# Patient Record
Sex: Female | Born: 1980 | Race: White | Hispanic: No | Marital: Married | Smoking: Current some day smoker
Health system: Southern US, Community
[De-identification: ages and names within clinical notes are randomized; demographics above are authoritative.]

## PROBLEM LIST (undated history)

## (undated) DIAGNOSIS — G4730 Sleep apnea, unspecified: Secondary | ICD-10-CM

## (undated) DIAGNOSIS — F32A Depression, unspecified: Secondary | ICD-10-CM

## (undated) DIAGNOSIS — T7840XA Allergy, unspecified, initial encounter: Secondary | ICD-10-CM

## (undated) DIAGNOSIS — M1990 Unspecified osteoarthritis, unspecified site: Secondary | ICD-10-CM

## (undated) DIAGNOSIS — F909 Attention-deficit hyperactivity disorder, unspecified type: Secondary | ICD-10-CM

## (undated) DIAGNOSIS — G43009 Migraine without aura, not intractable, without status migrainosus: Secondary | ICD-10-CM

## (undated) DIAGNOSIS — F419 Anxiety disorder, unspecified: Secondary | ICD-10-CM

## (undated) HISTORY — DX: Unspecified osteoarthritis, unspecified site: M19.90

## (undated) HISTORY — DX: Attention-deficit hyperactivity disorder, unspecified type: F90.9

## (undated) HISTORY — DX: Migraine without aura, not intractable, without status migrainosus: G43.009

## (undated) HISTORY — DX: Allergy, unspecified, initial encounter: T78.40XA

## (undated) HISTORY — DX: Depression, unspecified: F32.A

## (undated) HISTORY — PX: MOUTH SURGERY: SHX715

## (undated) HISTORY — DX: Sleep apnea, unspecified: G47.30

## (undated) HISTORY — DX: Anxiety disorder, unspecified: F41.9

---

## 2004-11-06 ENCOUNTER — Other Ambulatory Visit: Admission: RE | Admit: 2004-11-06 | Discharge: 2004-11-06 | Payer: Self-pay | Admitting: Obstetrics & Gynecology

## 2014-01-10 ENCOUNTER — Ambulatory Visit (HOSPITAL_COMMUNITY)
Admission: RE | Admit: 2014-01-10 | Discharge: 2014-01-10 | Disposition: A | Discharge status: Home / Self Care | Payer: 59 | Source: Ambulatory Visit | Attending: Family Medicine | Admitting: Family Medicine

## 2014-01-10 ENCOUNTER — Emergency Department (HOSPITAL_COMMUNITY)
Admission: EM | Admit: 2014-01-10 | Discharge: 2014-01-10 | Discharge status: Against Medical Advice | Payer: 59 | Attending: Emergency Medicine | Admitting: Emergency Medicine

## 2014-01-10 ENCOUNTER — Ambulatory Visit (INDEPENDENT_AMBULATORY_CARE_PROVIDER_SITE_OTHER): Payer: 59 | Admitting: Family Medicine

## 2014-01-10 ENCOUNTER — Encounter (HOSPITAL_COMMUNITY): Payer: Self-pay | Admitting: Emergency Medicine

## 2014-01-10 VITALS — BP 110/70 | HR 57 | Temp 98.0°F | Resp 14 | Ht 65.75 in | Wt 232.0 lb

## 2014-01-10 DIAGNOSIS — N281 Cyst of kidney, acquired: Secondary | ICD-10-CM | POA: Insufficient documentation

## 2014-01-10 DIAGNOSIS — R1032 Left lower quadrant pain: Secondary | ICD-10-CM

## 2014-01-10 DIAGNOSIS — Z720 Tobacco use: Secondary | ICD-10-CM

## 2014-01-10 DIAGNOSIS — F17200 Nicotine dependence, unspecified, uncomplicated: Secondary | ICD-10-CM

## 2014-01-10 DIAGNOSIS — R195 Other fecal abnormalities: Secondary | ICD-10-CM

## 2014-01-10 DIAGNOSIS — R109 Unspecified abdominal pain: Secondary | ICD-10-CM | POA: Insufficient documentation

## 2014-01-10 LAB — POCT CBC
GRANULOCYTE PERCENT: 74.6 % (ref 37–80)
HEMATOCRIT: 43.5 % (ref 37.7–47.9)
HEMOGLOBIN: 14 g/dL (ref 12.2–16.2)
Lymph, poc: 1.9 (ref 0.6–3.4)
MCH, POC: 29.2 pg (ref 27–31.2)
MCHC: 32.2 g/dL (ref 31.8–35.4)
MCV: 90.6 fL (ref 80–97)
MID (cbc): 0.4 (ref 0–0.9)
MPV: 8.2 fL (ref 0–99.8)
POC GRANULOCYTE: 6.8 (ref 2–6.9)
POC LYMPH PERCENT: 20.6 %L (ref 10–50)
POC MID %: 4.8 %M (ref 0–12)
Platelet Count, POC: 328 10*3/uL (ref 142–424)
RBC: 4.8 M/uL (ref 4.04–5.48)
RDW, POC: 14.5 %
WBC: 9.1 10*3/uL (ref 4.6–10.2)

## 2014-01-10 LAB — COMPREHENSIVE METABOLIC PANEL
ALBUMIN: 4.4 g/dL (ref 3.5–5.2)
ALT: 12 U/L (ref 0–35)
AST: 14 U/L (ref 0–37)
Alkaline Phosphatase: 61 U/L (ref 39–117)
BUN: 9 mg/dL (ref 6–23)
CHLORIDE: 103 meq/L (ref 96–112)
CO2: 26 mEq/L (ref 19–32)
Calcium: 8.9 mg/dL (ref 8.4–10.5)
Creat: 0.78 mg/dL (ref 0.50–1.10)
Glucose, Bld: 112 mg/dL — ABNORMAL HIGH (ref 70–99)
POTASSIUM: 4.6 meq/L (ref 3.5–5.3)
Sodium: 137 mEq/L (ref 135–145)
Total Bilirubin: 0.6 mg/dL (ref 0.2–1.2)
Total Protein: 6.9 g/dL (ref 6.0–8.3)

## 2014-01-10 LAB — POCT URINALYSIS DIPSTICK
Bilirubin, UA: NEGATIVE
Glucose, UA: NEGATIVE
Ketones, UA: 15
Leukocytes, UA: NEGATIVE
NITRITE UA: NEGATIVE
PH UA: 7
PROTEIN UA: NEGATIVE
RBC UA: NEGATIVE
Spec Grav, UA: 1.025
UROBILINOGEN UA: 0.2

## 2014-01-10 LAB — POCT WET PREP WITH KOH
KOH Prep POC: NEGATIVE
Trichomonas, UA: NEGATIVE
Yeast Wet Prep HPF POC: NEGATIVE

## 2014-01-10 LAB — POCT SEDIMENTATION RATE: POCT SED RATE: 17 mm/h (ref 0–22)

## 2014-01-10 LAB — POCT UA - MICROSCOPIC ONLY
AMORPHOUS: POSITIVE
CASTS, UR, LPF, POC: NEGATIVE
CRYSTALS, UR, HPF, POC: NEGATIVE
Mucus, UA: NEGATIVE
Yeast, UA: NEGATIVE

## 2014-01-10 LAB — IFOBT (OCCULT BLOOD): IMMUNOLOGICAL FECAL OCCULT BLOOD TEST: POSITIVE

## 2014-01-10 MED ORDER — TRAMADOL HCL 50 MG PO TABS: 50.0000 mg | ORAL_TABLET | Freq: Three times a day (TID) | ORAL | Status: DC | PRN

## 2014-01-10 MED ORDER — IOHEXOL 300 MG/ML  SOLN
100.0000 mL | Freq: Once | INTRAMUSCULAR | Status: AC | PRN
  Administered 2014-01-10: 100 mL via INTRAVENOUS

## 2014-01-10 MED ORDER — IOHEXOL 300 MG/ML  SOLN
50.0000 mL | Freq: Once | INTRAMUSCULAR | Status: AC | PRN
  Administered 2014-01-10: 50 mL via ORAL

## 2014-01-10 MED ORDER — ACETAMINOPHEN-CODEINE #3 300-30 MG PO TABS
1.0000 | ORAL_TABLET | ORAL | Status: DC | PRN
Start: 2014-01-10 — End: 2016-10-29

## 2014-01-10 NOTE — ED Notes (Signed)
Dr. Fredderick PhenixBelfi spoke with physician at Urgent Care and patient was suppose to have an outpatient CT scan. Registration notified.

## 2014-01-10 NOTE — ED Notes (Signed)
Per pt, abdominal pain since yesterday.  Nausea, no vomiting/diarrhea.  Pt seen at Urgent Care today.  Labs/urine and pelvic done.  Small trace blood in rectum. Told to come to ED for CT scan.

## 2014-01-10 NOTE — Progress Notes (Signed)
Subjective:    Patient ID: Sherri Frey, female    DOB: 11/25/1980, 33 y.o.   MRN: [id number] This chart was scribed for Levell July. Clelia Croft, MD by Marica Otter, ED Scribe. This patient was seen in room 13 and the patient's care was started at 8:43 AM.    HPI HPI Comments: Sherri Frey is a 33 y.o. female who [facility] complaining of constant, generalized abdominal pain with associated nausea onset yesterday. Pt states the pain is worse in the periumbilical and LLQ presently. The pain woke her from sleep twice overnight. Pt reports taking ibuprofenwith some relief. Further, pt states eating afforded some relief. Pt reports she drove herself to the urgent care today and going over bumps on the road aggravated her abd pain. However, pt denies constipation, vomiting, fever, chills, or appetite change. Pt reports that her back pain as well as her heartburn and indigestion are at baseline.  Pt is sexually active with women only so does not use contraception. LMP last wk - was normal. No h/o any abd problems other than GERD, no h/o any abd surgeries, no h/o any pelvic problems or infections. Denies vag discharge.  History reviewed. No pertinent past medical history. No current outpatient prescriptions on file prior to visit.   No current facility-administered medications on file prior to visit.   No Known Allergies History reviewed. No pertinent past surgical history.  Review of Systems  Constitutional: Positive for activity change and fatigue. Negative for fever, chills, appetite change and unexpected weight change.  Respiratory: Negative for shortness of breath.   Cardiovascular: Negative for chest pain and leg swelling.  Gastrointestinal: Positive for nausea and abdominal pain. Negative for vomiting, diarrhea, constipation, blood in stool, abdominal distention, anal bleeding and rectal pain.  Genitourinary: Negative for dysuria, hematuria, decreased urine volume,  vaginal bleeding, vaginal discharge, difficulty urinating, genital sores, menstrual problem and pelvic pain.  Musculoskeletal: Positive for arthralgias, back pain and myalgias. Negative for gait problem.  Skin: Negative for rash.  Neurological: Negative for dizziness, syncope, weakness and light-headedness.  Hematological: Negative for adenopathy.  Psychiatric/Behavioral: Positive for sleep disturbance.   Objective:   Physical Exam  Nursing note and vitals reviewed. Constitutional: She is oriented to person, place, and time. She appears well-developed and well-nourished. No distress.  HENT:  Head: Normocephalic and atraumatic.  Eyes: EOM are normal.  Neck: Neck supple. No tracheal deviation present.  Cardiovascular: Normal rate, regular rhythm and normal heart sounds.   No murmur heard. Pulses:      Dorsalis pedis pulses are 2+ on the right side, and 2+ on the left side.  Pulmonary/Chest: Effort normal. No respiratory distress.  Abdominal: Soft. Normal appearance and bowel sounds are normal. She exhibits no mass. There is no hepatosplenomegaly. There is tenderness (LLQ and periumbilical. ) in the suprapubic area and left lower quadrant. There is no rigidity, no rebound, no guarding, no CVA tenderness, no tenderness at McBurney's point and negative Murphy's sign.  No referred pain.   Genitourinary: Rectum normal. Rectal exam shows no mass, no tenderness and anal tone normal. There is no rash or tenderness on the right labia. There is no rash or tenderness on the left labia. Uterus is tender. Uterus is not enlarged and not fixed. Cervix exhibits no motion tenderness, no discharge and no friability. Right adnexum displays no mass, no tenderness and no fullness. Left adnexum displays tenderness. Left adnexum displays no mass and no fullness. No erythema or tenderness  around the vagina. Vaginal discharge found.  Mild amount of clear mucoid vaginal discharge  Musculoskeletal: Normal range of  motion. She exhibits no edema.  No lower extremity edema  Neurological: She is alert and oriented to person, place, and time.  Skin: Skin is warm and dry.  Psychiatric: She has a normal mood and affect. Her behavior is normal.   BP 110/70  Pulse 57  Temp(Src) 98 F (36.7 C) (Oral)  Resp 14  Ht 5' 5.75" (1.67 m)  Wt 232 lb (105.235 kg)  BMI 37.73 kg/m2  SpO2 100%  LMP 01/03/2014  Results for orders placed in visit on 01/10/14  POCT UA - MICROSCOPIC ONLY      Result Value Ref Range   WBC, Ur, HPF, POC 0-1     RBC, urine, microscopic 0-1     Bacteria, U Microscopic trace     Mucus, UA neg     Epithelial cells, urine per micros 3-5     Crystals, Ur, HPF, POC neg     Casts, Ur, LPF, POC neg     Yeast, UA neg     Amorphous pos    POCT URINALYSIS DIPSTICK      Result Value Ref Range   Color, UA yellow     Clarity, UA clear     Glucose, UA neg     Bilirubin, UA neg     Ketones, UA 15     Spec Grav, UA 1.025     Blood, UA neg     pH, UA 7.0     Protein, UA neg     Urobilinogen, UA 0.2     Nitrite, UA neg     Leukocytes, UA Negative    POCT CBC      Result Value Ref Range   WBC 9.1  4.6 - 10.2 K/uL   Lymph, poc 1.9  0.6 - 3.4   POC LYMPH PERCENT 20.6  10 - 50 %L   MID (cbc) 0.4  0 - 0.9   POC MID % 4.8  0 - 12 %M   POC Granulocyte 6.8  2 - 6.9   Granulocyte percent 74.6  37 - 80 %G   RBC 4.80  4.04 - 5.48 M/uL   Hemoglobin 14.0  12.2 - 16.2 g/dL   HCT, POC 16.143.5  09.637.7 - 47.9 %   MCV 90.6  80 - 97 fL   MCH, POC 29.2  27 - 31.2 pg   MCHC 32.2  31.8 - 35.4 g/dL   RDW, POC 04.514.5     Platelet Count, POC 328  142 - 424 K/uL   MPV 8.2  0 - 99.8 fL  IFOBT (OCCULT BLOOD)      Result Value Ref Range   IFOBT Positive    POCT WET PREP WITH KOH      Result Value Ref Range   Trichomonas, UA Negative     Clue Cells Wet Prep HPF POC 0-1     Epithelial Wet Prep HPF POC 3-5     Yeast Wet Prep HPF POC neg     Bacteria Wet Prep HPF POC 2+     RBC Wet Prep HPF POC 0-1     WBC  Wet Prep HPF POC 5-7     KOH Prep POC Negative     EXAM: CT ABDOMEN AND PELVIS WITH CONTRAST  TECHNIQUE: Multidetector CT imaging of the abdomen and pelvis was performed using the standard protocol following bolus administration of intravenous  contrast.  CONTRAST: 50mL OMNIPAQUE IOHEXOL 300 MG/ML SOLN, 100mL OMNIPAQUE IOHEXOL 300 MG/ML SOLN  COMPARISON: None.  FINDINGS: Minimal scarring versus atelectasis anterior right lung base  The liver, spleen, adrenals, pancreas, right kidney are unremarkable. A 2 cm fluid attenuating low nodule lower pole left kidney. The kidneys otherwise unremarkable.  The bowel is negative. The appendix is identified is unremarkable. Gallbladder fossa is unremarkable.  No abdominal aortic aneurysm. The celiac, SMA, IMA, portal vein are opacified.  No abdominal or pelvic masses, free fluid, loculated fluid collections, nor adenopathy. No abdominal wall or inguinal hernia.  There no aggressive appearing osseous lesions. 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  IMPRESSION: Bosniak category 1 cyst lower pole left kidney.  No CT evidence of abdominal or pelvic pathology. No CT evidence accounting for the patient's clinical presentation.   Electronically Signed By: Salome HolmesHector Cooper M.D. On: 01/10/2014 14:28  Assessment & Plan:   8:50 AM-Discussed treatment plan, which includes a pelvic exam, with pt at bedside and pt agreed to plan.   9:34 AM-  Discussed lab result that showed trace amounts of blood in stool - this raises the concern for possible diverticulitis as cause of pt's LLQ abd pain. Therefore, will proceed w/ stat abd/pelvic CT w/ contras.  Abdominal pain, left lower quadrant - Plan: POCT UA - Microscopic Only, POCT urinalysis dipstick, POCT CBC, IFOBT POC (occult bld, rslt in office), POCT SEDIMENTATION RATE, POCT Wet Prep with KOH, Comprehensive metabolic panel, GC/Chlamydia  Probe Amp, CT Abdomen Pelvis W Contrast, CANCELED: GC/chlamydia probe amp, urine, CANCELED: HIV antibody  Positive fecal occult blood test  Tobacco abuse  ADDENDUM:  Abd/pelvic CT nml  - called in tylenol #3 - unknown cause of pt's abd pain - if worsening RTC immed. If not resolving w/in 24-48 hrs, rec recheck. Meds ordered this encounter  Medications  . FLUoxetine (PROZAC) 40 MG capsule    Sig: Take 40 mg by mouth daily.  Marland Kitchen. DISCONTD: traMADol (ULTRAM) 50 MG tablet    Sig: Take 1 tablet (50 mg total) by mouth every 8 (eight) hours as needed.    Dispense:  30 tablet    Refill:  0  . acetaminophen-codeine (TYLENOL #3) 300-30 MG per tablet    Sig: Take 1-2 tablets by mouth every 4 (four) hours as needed for moderate pain.    Dispense:  30 tablet    Refill:  0    I personally performed the services described in this documentation, which was scribed in my presence. The recorded information has been reviewed and considered, and addended by me as needed.  Norberto SorensonEva Shaw, MD MPH

## 2014-01-10 NOTE — ED Provider Notes (Signed)
Pt wrongly checked in to the ED.  I spoke with Dr. Clelia CroftShaw and pt was supposed to go to radiology for an outpt CT.  Pt does not want to be seen in the ED.  Rolan BuccoMelanie Naia Ruff, MD 01/10/14 [phone]

## 2014-01-11 LAB — GC/CHLAMYDIA PROBE AMP
CT Probe RNA: NEGATIVE
GC Probe RNA: NEGATIVE

## 2014-01-12 ENCOUNTER — Telehealth: Payer: Self-pay | Admitting: *Deleted

## 2014-01-12 NOTE — Telephone Encounter (Signed)
CT abd with contrast [facility] [employer].  Notification # [id number].  Exp 02/26/14

## 2014-01-12 NOTE — Telephone Encounter (Signed)
Referral entered 01/10/14 Insurance request peer to peer review within 3 days of request: Case Number: [id number] Phone: [phone] Option 3

## 2014-01-13 ENCOUNTER — Telehealth: Payer: Self-pay

## 2014-01-13 MED ORDER — METRONIDAZOLE 500 MG PO TABS: 500.0000 mg | ORAL_TABLET | Freq: Two times a day (BID) | ORAL | Status: DC

## 2014-01-13 NOTE — Telephone Encounter (Signed)
PATIENT STATES SHE SAW DR. SHAW ON Sunday FOR ABDOMINAL PAIN. DR. Clelia CroftSHAW TOLD HER TO CALL BACK ON Wednesday IF SHE WAS NOT BETTER AND NEEDED TO HAVE AN ANTIBIOTIC CALLED INTO HER PHARMACY. SHE DOES WANT DR. SHAW TO CALL ONE IN FOR HER. BEST PHONE [phone] (CELL)  PHARMACY CHOICE IS [facility] ON [address]   MBC

## 2014-01-13 NOTE — Telephone Encounter (Signed)
Pt advised. She states the pain has gotten much better. Still present but less than it was yesterday.

## 2014-01-13 NOTE — Telephone Encounter (Signed)
Sent in flagyl for empiric trial since unknown cause of pain. if worsening RTC immed. If not resolving w/in 24-48 hrs, rec recheck.

## 2014-01-13 NOTE — Telephone Encounter (Signed)
LMVM to CB. 

## 2016-10-29 ENCOUNTER — Ambulatory Visit (INDEPENDENT_AMBULATORY_CARE_PROVIDER_SITE_OTHER): Payer: PRIVATE HEALTH INSURANCE | Admitting: Family Medicine

## 2016-10-29 VITALS — BP 130/71 | HR 72 | Temp 98.1°F | Resp 16 | Ht 65.0 in | Wt 245.0 lb

## 2016-10-29 DIAGNOSIS — J40 Bronchitis, not specified as acute or chronic: Secondary | ICD-10-CM | POA: Diagnosis not present

## 2016-10-29 DIAGNOSIS — J329 Chronic sinusitis, unspecified: Secondary | ICD-10-CM | POA: Diagnosis not present

## 2016-10-29 MED ORDER — BENZONATATE 200 MG PO CAPS: 200.0000 mg | ORAL_CAPSULE | Freq: Three times a day (TID) | ORAL | 0 refills | Status: DC | PRN

## 2016-10-29 MED ORDER — AMOXICILLIN 500 MG PO CAPS: 1000.0000 mg | ORAL_CAPSULE | Freq: Two times a day (BID) | ORAL | 0 refills | Status: DC

## 2016-10-29 MED ORDER — HYDROCOD POLST-CPM POLST ER 10-8 MG/5ML PO SUER: 5.0000 mL | Freq: Every evening | ORAL | 0 refills | Status: DC | PRN

## 2016-10-29 MED ORDER — PREDNISONE 20 MG PO TABS: ORAL_TABLET | ORAL | 0 refills | Status: DC

## 2016-10-29 NOTE — Patient Instructions (Addendum)
     IF you received an x-ray today, you will receive an invoice from [facility]. Please contact [facility] at [phone] with questions or concerns regarding your invoice.   IF you received labwork today, you will receive an invoice from [facility]. Please contact [facility] at [phone] with questions or concerns regarding your invoice.   Our billing staff will not be able to assist you with questions regarding bills from these companies.  You will be contacted with the lab results as soon as they are available. The fastest way to get your results is to activate your My Chart account. Instructions are located on the last page of this paperwork. If you have not heard from us regarding the results in 2 weeks, please contact this office.      Barotitis Media Barotitis media is inflammation of your middle ear. This occurs when the auditory tube (eustachian tube) leading from the back of your nose (nasopharynx) to your eardrum is blocked. This blockage may result from a cold, environmental allergies, or an upper respiratory infection. Unresolved barotitis media may lead to damage or hearing loss (barotrauma), which may become permanent. HOME CARE INSTRUCTIONS   Use medicines as recommended by your health care provider. Over-the-counter medicines will help unblock the canal and can help during times of air travel.  Do not put anything into your ears to clean or unplug them. Eardrops will not be helpful.  Do not swim, dive, or fly until your health care provider says it is all right to do so. If these activities are necessary, chewing gum with frequent, forceful swallowing may help. It is also helpful to hold your nose and gently blow to pop your ears for equalizing pressure changes. This forces air into the eustachian tube.  Only take over-the-counter or prescription medicines for pain, discomfort, or fever as directed by your health care provider.  A decongestant may be  helpful in decongesting the middle ear and make pressure equalization easier. SEEK MEDICAL CARE IF:  You experience a serious form of dizziness in which you feel as if the room is spinning and you feel nauseated (vertigo).  Your symptoms only involve one ear. SEEK IMMEDIATE MEDICAL CARE IF:   You develop a severe headache, dizziness, or severe ear pain.  You have bloody or pus-like drainage from your ears.  You develop a fever.  Your problems do not improve or become worse. MAKE SURE YOU:   Understand these instructions.  Will watch your condition.  Will get help right away if you are not doing well or get worse. This information is not intended to replace advice given to you by your health care provider. Make sure you discuss any questions you have with your health care provider. Document Released: 08/17/2000 Document Revised: 06/10/2013 Document Reviewed: 03/17/2013 Elsevier Interactive Patient Education  2017 [employer]

## 2016-10-29 NOTE — Progress Notes (Signed)
Subjective:    Patient ID: Sherri Frey, female    DOB: [birth year], 36 y.o.   MRN: [id number]  Chief Complaint  Patient presents with  . Cough    x 1 wk  . Sinusitis    x 1 wk  . Ear Pain    right/ x 1 wk    HPI   Started with sore throat 1 wk ago and then progressed into cough occ productive of yellow-green sputum. Some ShoB and wheezing.  Not sleeping well due to cough. Chest pain with cough. No f/c. Has been exposed to bronchitis and pneumonia. Right ear with decreased hearing. + rhinitis. + PND.  Dayquil/nyquil/afrin, mucinex.   Vaping still 0.3 nicotine throughout the day.  Stopped smoking 2 yrs prior. Perhaps very distant h/o inhaler use.  No past medical history on file. No past surgical history on file. Current Outpatient Prescriptions on File Prior to Visit  Medication Sig Dispense Refill  . FLUoxetine (PROZAC) 40 MG capsule Take 40 mg by mouth daily.    Marland Kitchen. ibuprofen (ADVIL,MOTRIN) 200 MG tablet Take 400 mg by mouth every 6 (six) hours as needed for moderate pain.    Marland Kitchen. acetaminophen-codeine (TYLENOL #3) 300-30 MG per tablet Take 1-2 tablets by mouth every 4 (four) hours as needed for moderate pain. (Patient not taking: Reported on 10/29/2016) 30 tablet 0  . fluticasone (FLONASE) 50 MCG/ACT nasal spray Place 2 sprays into both nostrils daily as needed for allergies or rhinitis.    Marland Kitchen. metroNIDAZOLE (FLAGYL) 500 MG tablet Take 1 tablet (500 mg total) by mouth 2 (two) times daily. (Patient not taking: Reported on 10/29/2016) 14 tablet 0  . omeprazole (PRILOSEC) 20 MG capsule Take 20 mg by mouth daily.     No current facility-administered medications on file prior to visit.    No Known Allergies No family history on file. Social History   Social History  . Marital status: Married    Spouse name: N/A  . Number of children: N/A  . Years of education: N/A   Social History Main Topics  . Smoking status: Current Some Day Smoker    Types: Cigarettes, E-cigarettes  . Smokeless  tobacco: Never Used  . Alcohol use Yes     Comment: social  . Drug use: No  . Sexual activity: Yes    Birth control/ protection: None   Other Topics Concern  . Not on file   Social History Narrative  . No narrative on file   [facility] screen [facility] 10/29/2016  Decreased Interest 0  Down, Depressed, Hopeless 0  PHQ - 2 Score 0     Review of Systems  Constitutional: Positive for fatigue. Negative for activity change, appetite change, chills, diaphoresis and fever.  HENT: Positive for congestion, ear pain, hearing loss, postnasal drip, rhinorrhea, sinus pain, sinus pressure, sneezing and sore throat. Negative for ear discharge, nosebleeds, trouble swallowing and voice change.   Eyes: Negative for discharge and itching.  Respiratory: Positive for cough, shortness of breath and wheezing. Negative for chest tightness.   Cardiovascular: Positive for chest pain.  Gastrointestinal: Negative for abdominal pain, nausea and vomiting.  Musculoskeletal: Negative for neck pain and neck stiffness.  Skin: Negative for rash.  Neurological: Positive for headaches. Negative for dizziness and syncope.  Hematological: Positive for adenopathy.  Psychiatric/Behavioral: Positive for sleep disturbance.       Objective:   Physical Exam  Constitutional: She is oriented to person, place, and time. She appears well-developed and well-nourished. She  appears lethargic. She appears ill. No distress.  HENT:  Head: Normocephalic and atraumatic.  Right Ear: External ear and ear canal normal. Tympanic membrane is retracted. A middle ear effusion is present.  Left Ear: External ear and ear canal normal. Tympanic membrane is retracted. A middle ear effusion is present.  Nose: Mucosal edema and rhinorrhea present. Right sinus exhibits maxillary sinus tenderness. Left sinus exhibits maxillary sinus tenderness.  Mouth/Throat: Uvula is midline and mucous membranes are normal. Posterior oropharyngeal erythema  present. No oropharyngeal exudate, posterior oropharyngeal edema or tonsillar abscesses.  Eyes: Conjunctivae are normal. Right eye exhibits no discharge. Left eye exhibits no discharge. No scleral icterus.  Neck: Normal range of motion. Neck supple.  Cardiovascular: Normal rate, regular rhythm, normal heart sounds and intact distal pulses.   Pulmonary/Chest: Effort normal and breath sounds normal.  Lymphadenopathy:       Head (right side): Submandibular adenopathy present. No preauricular and no posterior auricular adenopathy present.       Head (left side): Submandibular adenopathy present. No preauricular and no posterior auricular adenopathy present.    She has no cervical adenopathy.       Right: No supraclavicular adenopathy present.       Left: No supraclavicular adenopathy present.  Neurological: She is oriented to person, place, and time. She appears lethargic.  Skin: Skin is warm and dry. She is not diaphoretic. No erythema.  Psychiatric: She has a normal mood and affect. Her behavior is normal.    BP 130/71   Pulse 72   Temp 98.1 F (36.7 C) (Oral)   Resp 16   Ht 5\' 5"  (1.651 m)   Wt 245 lb (111.1 kg)   LMP 10/29/2016   SpO2 97%   BMI 40.77 kg/m      Assessment & Plan:   1. Sinobronchitis     Meds ordered this encounter  Medications  . amoxicillin (AMOXIL) 500 MG capsule    Sig: Take 2 capsules (1,000 mg total) by mouth 2 (two) times daily.    Dispense:  40 capsule    Refill:  0  . predniSONE (DELTASONE) 20 MG tablet    Sig: Take 3 tabs qd x 3d, then 2 tabs qd x 3d then 1 tab qd x 3d.    Dispense:  18 tablet    Refill:  0  . chlorpheniramine-HYDROcodone (TUSSIONEX PENNKINETIC ER) 10-8 MG/5ML SUER    Sig: Take 5 mLs by mouth at bedtime as needed.    Dispense:  90 mL    Refill:  0  . benzonatate (TESSALON) 200 MG capsule    Sig: Take 1 capsule (200 mg total) by mouth 3 (three) times daily as needed for cough.    Dispense:  40 capsule    Refill:  0      Norberto Sorenson, M.D.  Primary Care at [facility] [address] [place], [place] [address] [phone] phone [phone] fax  11/30/16 2:06 AM

## 2017-09-04 ENCOUNTER — Ambulatory Visit: Payer: PRIVATE HEALTH INSURANCE | Admitting: Physician Assistant

## 2017-09-04 ENCOUNTER — Encounter: Payer: Self-pay | Admitting: Physician Assistant

## 2017-09-04 ENCOUNTER — Ambulatory Visit (INDEPENDENT_AMBULATORY_CARE_PROVIDER_SITE_OTHER): Payer: PRIVATE HEALTH INSURANCE | Admitting: Physician Assistant

## 2017-09-04 ENCOUNTER — Ambulatory Visit (INDEPENDENT_AMBULATORY_CARE_PROVIDER_SITE_OTHER): Payer: PRIVATE HEALTH INSURANCE

## 2017-09-04 VITALS — BP 124/80 | HR 83 | Temp 98.5°F | Resp 17 | Ht 67.5 in | Wt 251.0 lb

## 2017-09-04 DIAGNOSIS — M25511 Pain in right shoulder: Secondary | ICD-10-CM | POA: Diagnosis not present

## 2017-09-04 DIAGNOSIS — M7581 Other shoulder lesions, right shoulder: Secondary | ICD-10-CM | POA: Diagnosis not present

## 2017-09-04 DIAGNOSIS — G8929 Other chronic pain: Secondary | ICD-10-CM

## 2017-09-04 MED ORDER — MELOXICAM 15 MG PO TABS: 15.0000 mg | ORAL_TABLET | Freq: Every day | ORAL | 0 refills | Status: DC

## 2017-09-04 NOTE — Progress Notes (Signed)
09/04/2017 4:02 PM   DOB: 1980-12-22 / MRN: [id number]  SUBJECTIVE:  Sherri Frey is a 37 y.o. female presenting for right shoulder pain present for months now and is worsening.  Lift the arm makes the pain worse.  Has tried Ibuprofen and tylenol and these help temporarily.  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  She can not afford to be out of work.   She has No Known Allergies.   She  has no past medical history on file.    She  reports that she has been smoking cigarettes and e-cigarettes.  she has never used smokeless tobacco. She reports that she drinks alcohol. She reports that she does not use drugs. She  reports that she currently engages in sexual activity. She reports using the following method of birth control/protection: None. The patient  has no past surgical history on file.  Her family history is not on file.  Review of Systems  Constitutional: Negative for chills, diaphoresis and fever.  Eyes: Negative.   Respiratory: Negative for cough, hemoptysis, sputum production, shortness of breath and wheezing.   Cardiovascular: Negative for chest pain, orthopnea and leg swelling.  Gastrointestinal: Negative for nausea.  Skin: Negative for rash.  Neurological: Negative for dizziness, sensory change, speech change, focal weakness and headaches.    The problem list and medications were reviewed and updated by myself where necessary and exist elsewhere in the encounter.   OBJECTIVE:  BP 124/80   Pulse 83   Temp 98.5 F (36.9 C) (Oral)   Resp 17   Ht 5' 7.5" (1.715 m)   Wt 251 lb (113.9 kg)   LMP 08/04/2017 (Approximate)   SpO2 98%   BMI 38.73 kg/m   Physical Exam  Constitutional: She is active.  Non-toxic appearance.  Cardiovascular: Normal rate.  Pulmonary/Chest: Effort normal. No tachypnea.  Musculoskeletal: Normal range of motion. She exhibits tenderness (Positive Hawkins-Kennedy.). She exhibits no edema or deformity.  Neurological: She is  alert.  Skin: Skin is warm and dry. She is not diaphoretic. No pallor.      No results found for this or any previous visit (from the past 72 hour(s)).  Dg Shoulder Right  Result Date: 09/04/2017 CLINICAL DATA:  Right shoulder pain for months worsening over the past month. Positive Hawkins -Kennedy test. EXAM: RIGHT SHOULDER - 2+ VIEW COMPARISON:  None. FINDINGS: There is no evidence of fracture or dislocation. No osseous signs of advanced to shoulder impingement. There does appear to be slight lateral downsloping of the acromion rollover. No subchondral or subcortical cysts of the humeral head. Soft tissues are unremarkable. IMPRESSION: 1. Slight lateral downsloping of the acromion without subcortical cystic change of the humeral head. 2. No acute osseous abnormality is noted. Electronically Signed   By: Tollie Eth M.D.   On: 09/04/2017 15:44    ASSESSMENT AND PLAN:  Sherri Frey was seen today for shoulder pain.  Diagnoses and all orders for this visit:  Chronic right shoulder pain: Given chronicity will refer to ortho as she will likely need an MRI.  Advised that she wear a sling but she can not afford to not work.  Stopping her home NSAID regimen and starting Meloxicam.  -     DG Shoulder Right; Future -     Hemoglobin A1c -     Basic Metabolic Panel -     CBC  Rotator cuff tendonitis, right -     AMB referral to  orthopedics  Other orders -     meloxicam (MOBIC) 15 MG tablet; Take 1 tablet (15 mg total) by mouth daily. Don't mix with Iburpofen, Aleve, Aspirin. Okay to take tylenol.    The patient is advised to call or return to clinic if she does not see an improvement in symptoms, or to seek the care of the closest emergency department if she worsens with the above plan.   Deliah BostonMichael Lundon Verdejo, MHS, PA-C Primary Care at [facility] 09/04/2017 4:02 PM

## 2017-09-04 NOTE — Patient Instructions (Addendum)
  Dg Shoulder Right  Result Date: 09/04/2017 CLINICAL DATA:  Right shoulder pain for months worsening over the past month. Positive Hawkins -Kennedy test. EXAM: RIGHT SHOULDER - 2+ VIEW COMPARISON:  None. FINDINGS: There is no evidence of fracture or dislocation. No osseous signs of advanced to shoulder impingement. There does appear to be slight lateral downsloping of the acromion rollover. No subchondral or subcortical cysts of the humeral head. Soft tissues are unremarkable. IMPRESSION: 1. Slight lateral downsloping of the acromion without subcortical cystic change of the humeral head. 2. No acute osseous abnormality is noted. Electronically Signed   By: Tollie Ethavid  Kwon M.D.   On: 09/04/2017 15:44     IF you received an x-ray today, you will receive an invoice from [facility]. Please contact [facility] at [phone] with questions or concerns regarding your invoice.   IF you received labwork today, you will receive an invoice from [facility]. Please contact [facility] at [phone] with questions or concerns regarding your invoice.   Our billing staff will not be able to assist you with questions regarding bills from these companies.  You will be contacted with the lab results as soon as they are available. The fastest way to get your results is to activate your My Chart account. Instructions are located on the last page of this paperwork. If you have not heard from us regarding the results in 2 weeks, please contact this office.

## 2017-09-05 LAB — BASIC METABOLIC PANEL
BUN/Creatinine Ratio: 15 (ref 9–23)
BUN: 11 mg/dL (ref 6–20)
CHLORIDE: 104 mmol/L (ref 96–106)
CO2: 22 mmol/L (ref 20–29)
Calcium: 9.3 mg/dL (ref 8.7–10.2)
Creatinine, Ser: 0.72 mg/dL (ref 0.57–1.00)
GFR calc Af Amer: 126 mL/min/{1.73_m2} (ref >59)
GFR, EST NON AFRICAN AMERICAN: 110 mL/min/{1.73_m2} (ref >59)
Glucose: 100 mg/dL — ABNORMAL HIGH (ref 65–99)
POTASSIUM: 4.6 mmol/L (ref 3.5–5.2)
Sodium: 141 mmol/L (ref 134–144)

## 2017-09-05 LAB — CBC
Hematocrit: 38.8 % (ref 34.0–46.6)
Hemoglobin: 12.6 g/dL (ref 11.1–15.9)
MCH: 26.8 pg (ref 26.6–33.0)
MCHC: 32.5 g/dL (ref 31.5–35.7)
MCV: 82 fL (ref 79–97)
Platelets: 301 10*3/uL (ref 150–379)
RBC: 4.71 x10E6/uL (ref 3.77–5.28)
RDW: 14.7 % (ref 12.3–15.4)
WBC: 9 10*3/uL (ref 3.4–10.8)

## 2017-09-05 LAB — HEMOGLOBIN A1C
ESTIMATED AVERAGE GLUCOSE: 108 mg/dL
HEMOGLOBIN A1C: 5.4 % (ref 4.8–5.6)

## 2018-07-07 ENCOUNTER — Ambulatory Visit (INDEPENDENT_AMBULATORY_CARE_PROVIDER_SITE_OTHER): Payer: PRIVATE HEALTH INSURANCE | Admitting: Family Medicine

## 2018-07-07 ENCOUNTER — Other Ambulatory Visit: Payer: Self-pay

## 2018-07-07 ENCOUNTER — Encounter: Payer: Self-pay | Admitting: Family Medicine

## 2018-07-07 VITALS — BP 129/75 | HR 74 | Temp 98.6°F | Resp 17 | Ht 67.5 in | Wt 252.4 lb

## 2018-07-07 DIAGNOSIS — J301 Allergic rhinitis due to pollen: Secondary | ICD-10-CM

## 2018-07-07 DIAGNOSIS — J069 Acute upper respiratory infection, unspecified: Secondary | ICD-10-CM

## 2018-07-07 MED ORDER — FLUTICASONE PROPIONATE 50 MCG/ACT NA SUSP: 2.0000 | Freq: Every day | NASAL | 6 refills | Status: DC

## 2018-07-07 MED ORDER — GUAIFENESIN ER 600 MG PO TB12: 600.0000 mg | ORAL_TABLET | Freq: Two times a day (BID) | ORAL | 3 refills | Status: DC

## 2018-07-07 NOTE — Progress Notes (Signed)
Chief Complaint  Patient presents with  . URI    cough, rn, st, yellow mucus drainage x 1 1/2 days.  Advil last night for sxs, no fevers present.  Eating and drinking normally.    HPI  Pt with 1.5 days of  Cough with yellow drainage No fevers No asthma She vapes No wheezing No shortness of breath  History reviewed. No pertinent past medical history.  Current Outpatient Medications  Medication Sig Dispense Refill  . FLUoxetine (PROZAC) 40 MG capsule Take 60 mg by mouth daily.     Marland Kitchen ibuprofen (ADVIL,MOTRIN) 200 MG tablet Take 400 mg by mouth every 6 (six) hours as needed for moderate pain.    . ranitidine (ZANTAC) 150 MG tablet Take 150 mg by mouth 2 (two) times daily.    . traZODone (DESYREL) 50 MG tablet Take 50 mg by mouth at bedtime as needed for sleep.    . fluticasone (FLONASE) 50 MCG/ACT nasal spray Place 2 sprays into both nostrils daily. 16 g 6  . guaiFENesin (MUCINEX) 600 MG 12 hr tablet Take 1 tablet (600 mg total) by mouth 2 (two) times daily. 30 tablet 3  . meloxicam (MOBIC) 15 MG tablet Take 1 tablet (15 mg total) by mouth daily. Don't mix with Iburpofen, Aleve, Aspirin. Okay to take tylenol. (Patient not taking: Reported on 07/07/2018) 42 tablet 0   No current facility-administered medications for this visit.     Allergies: No Known Allergies  History reviewed. No pertinent surgical history.  Social History   Socioeconomic History  . Marital status: Married    Spouse name: Not on file  . Number of children: Not on file  . Years of education: Not on file  . Highest education level: Not on file  Occupational History  . Not on file  Social Needs  . Financial resource strain: Not on file  . Food insecurity:    Worry: Not on file    Inability: Not on file  . Transportation needs:    Medical: Not on file    Non-medical: Not on file  Tobacco Use  . Smoking status: Current Some Day Smoker    Types: Cigarettes, E-cigarettes  . Smokeless tobacco: Never Used   Substance and Sexual Activity  . Alcohol use: Yes    Comment: social  . Drug use: No  . Sexual activity: Yes    Birth control/protection: None  Lifestyle  . Physical activity:    Days per week: Not on file    Minutes per session: Not on file  . Stress: Not on file  Relationships  . Social connections:    Talks on phone: Not on file    Gets together: Not on file    Attends religious service: Not on file    Active member of club or organization: Not on file    Attends meetings of clubs or organizations: Not on file    Relationship status: Not on file  Other Topics Concern  . Not on file  Social History Narrative  . Not on file    History reviewed. No pertinent family history.   ROS Review of Systems See HPI Constitution: No fevers or chills No malaise No diaphoresis Skin: No rash or itching Eyes: no blurry vision, no double vision GU: no dysuria or hematuria Neuro: no dizziness or headaches all others reviewed and negative   Objective: Vitals:   07/07/18 1443  BP: 129/75  Pulse: 74  Resp: 17  Temp: 98.6 F (37 C)  TempSrc: Oral  SpO2: 98%  Weight: 252 lb 6.4 oz (114.5 kg)  Height: 5' 7.5" (1.715 m)    Physical Exam General: alert, oriented, in NAD Head: normocephalic, atraumatic, no sinus tenderness Eyes: EOM intact, no scleral icterus or conjunctival injection Ears: TM clear bilaterally Nose: mucosa nonerythematous, nonedematous Throat: no pharyngeal exudate or erythema Lymph: no posterior auricular, submental or cervical lymph adenopathy Heart: normal rate, normal sinus rhythm, no murmurs Lungs: clear to auscultation bilaterally, no wheezing   Assessment and Plan Sherri Frey was seen today for uri.  Diagnoses and all orders for this visit:  Acute URI  Seasonal allergic rhinitis due to pollen  Other orders -     fluticasone (FLONASE) 50 MCG/ACT nasal spray; Place 2 sprays into both nostrils daily. -     guaiFENesin (MUCINEX) 600 MG 12 hr tablet;  Take 1 tablet (600 mg total) by mouth 2 (two) times daily.  Advised supportive care   [place] Deleo A Creta Levin

## 2018-07-07 NOTE — Patient Instructions (Signed)
° ° ° °  If you have lab work done today you will be contacted with your lab results within the next 2 weeks.  If you have not heard from us then please contact us. The fastest way to get your results is to register for My Chart. ° ° °IF you received an x-ray today, you will receive an invoice from [facility]. Please contact [facility] at [phone] with questions or concerns regarding your invoice.  ° °IF you received labwork today, you will receive an invoice from [facility]. Please contact [facility] at [phone] with questions or concerns regarding your invoice.  ° °Our billing staff will not be able to assist you with questions regarding bills from these companies. ° °You will be contacted with the lab results as soon as they are available. The fastest way to get your results is to activate your My Chart account. Instructions are located on the last page of this paperwork. If you have not heard from us regarding the results in 2 weeks, please contact this office. °  ° ° ° °

## 2019-02-18 ENCOUNTER — Ambulatory Visit (INDEPENDENT_AMBULATORY_CARE_PROVIDER_SITE_OTHER): Payer: PRIVATE HEALTH INSURANCE | Admitting: Family Medicine

## 2019-02-18 ENCOUNTER — Encounter: Payer: Self-pay | Admitting: Family Medicine

## 2019-02-18 ENCOUNTER — Other Ambulatory Visit: Payer: Self-pay

## 2019-02-18 VITALS — BP 130/78 | HR 88 | Temp 98.8°F | Resp 12 | Wt 250.6 lb

## 2019-02-18 DIAGNOSIS — Z23 Encounter for immunization: Secondary | ICD-10-CM | POA: Diagnosis not present

## 2019-02-18 DIAGNOSIS — M778 Other enthesopathies, not elsewhere classified: Secondary | ICD-10-CM

## 2019-02-18 DIAGNOSIS — M779 Enthesopathy, unspecified: Secondary | ICD-10-CM | POA: Diagnosis not present

## 2019-02-18 DIAGNOSIS — M79641 Pain in right hand: Secondary | ICD-10-CM

## 2019-02-18 MED ORDER — MELOXICAM 7.5 MG PO TABS: 7.5000 mg | ORAL_TABLET | Freq: Every day | ORAL | 0 refills | Status: DC

## 2019-02-18 NOTE — Progress Notes (Signed)
Subjective:    Patient ID: Sherri Frey, female    DOB: 03/24/1981, 38 y.o.Sherri Frey   MRN: [id number]  HPI Sherri BellSara Frey is a 38 y.o. female Presents today for: Chief Complaint  Patient presents with  . Hand Pain    Patient is here today for right hand pain for a while now. Pain been getting worse in the last few days where wake up with pain and go to bed with pain   R hand pain: Started few weeks ago. Sore/tender on top of 2nd-3rd MC. More sore during day into evening, then ok next am - min stiffness. NKI.  Dog groomer. 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  Back at work past 3-4 weeks. Initially 4 days per week (prior 5-6 days). Tx: ibuprofen at night only initially, last week has remained sore during the day as well. Now ibuprofen 400mg  tid,  Ice, heat, compression glove, icy hot, biofreeze. No changes with those treatments.  Sleep/rest seems to be best.  Some swelling, no redness.     There are no active problems to display for this patient.  No past medical history on file. No past surgical history on file. No Known Allergies Prior to Admission medications   Medication Sig Start Date End Date Taking? Authorizing Provider  FLUoxetine (PROZAC) 40 MG capsule Take 60 mg by mouth daily.    Yes [provider]  ibuprofen (ADVIL,MOTRIN) 200 MG tablet Take 400 mg by mouth every 6 (six) hours as needed for moderate pain.   Yes [provider]  traZODone (DESYREL) 50 MG tablet Take 50 mg by mouth at bedtime as needed for sleep.   Yes [provider]   Social History   Socioeconomic History  . Marital status: Married    Spouse name: Not on file  . Number of children: Not on file  . Years of education: Not on file  . Highest education level: Not on file  Occupational History  . Not on file  Social Needs  . Financial resource strain: Not on file  . Food  insecurity    Worry: Not on file    Inability: Not on file  . Transportation needs    Medical: Not on file    Non-medical: Not on file  Tobacco Use  . Smoking status: Current Some Day Smoker    Types: Cigarettes, E-cigarettes  . Smokeless tobacco: Never Used  Substance and Sexual Activity  . Alcohol use: Yes    Comment: social  . Drug use: No  . Sexual activity: Yes    Birth control/protection: None  Lifestyle  . Physical activity    Days per week: Not on file    Minutes per session: Not on file  . Stress: Not on file  Relationships  . Social Musicianconnections    Talks on phone: Not on file    Gets together: Not on file    Attends religious service: Not on file    Active member of club or organization: Not on file    Attends meetings of clubs or organizations: Not on file    Relationship status: Not on file  . Intimate partner violence    Fear of current or ex partner: Not on file    Emotionally abused: Not on file    Physically abused: Not on file    Forced sexual activity: Not on file  Other Topics Concern  . Not on file  Social History Narrative  . Not on file    Review of Systems Per HPI.     Objective:   Physical Exam Constitutional:      General: She is not in acute distress.    Appearance: She is well-developed.  HENT:     Head: Normocephalic and atraumatic.  Cardiovascular:     Rate and Rhythm: Normal rate.  Pulmonary:     Effort: Pulmonary effort is normal.  Musculoskeletal:       Hands:  Neurological:     Mental Status: She is alert and oriented to person, place, and time.    Vitals:   02/18/19 1601  BP: 130/78  Pulse: 88  Resp: 12  Temp: 98.8 F (37.1 C)  TempSrc: Oral  SpO2: 98%  Weight: 250 lb 9.6 oz (113.7 kg)       Assessment & Plan:    Sherri BellSara Merica is a 38 y.o. female Hand tendonitis - Plan: Ambulatory referral to Hand Surgery, meloxicam (MOBIC) 7.5 MG tablet,  Right hand pain - Plan: Ambulatory referral to Hand Surgery, meloxicam  (MOBIC) 7.5 MG tablet,   - suspected extensor tendonitis, after decreased use then restart of activity in past month.   - relative rest, then slow return to repetitive activity, trial of mobic (cautioned about use with prozac).   - refer to hand specialist.   Need for prophylactic vaccination with combined diphtheria-tetanus-pertussis (DTP) vaccine - Plan: Tdap vaccine greater than or equal to 7yo IM given.    Meds ordered this encounter  Medications  . meloxicam (MOBIC) 7.5 MG tablet    Sig: Take 1-2 tablets (7.5-15 mg total) by mouth daily.    Dispense:  30 tablet    Refill:  0   Patient Instructions   Hand pain appears to be extensor tendon tendinitis at the top of your hand.  That may be due to the relative rest then restart of repetitive activity.    Would recommend complete rest for the next 4 to 5 days, then restart at half activity if pain has improved. If pain persists, may need to rest longer, with slower return to activity.   Can take meloxicam 1 to 2/day as needed just do not combine with ibuprofen or other anti-inflammatories.  I will refer you to hand specialist, but if symptoms have resolved, we can cancel that referral.  Return to the clinic or go to the nearest emergency room if any of your symptoms worsen or new symptoms occur.   Tendinitis  Tendinitis is inflammation of a tendon. A tendon is a strong cord of tissue that connects muscle to bone. Tendinitis can affect any tendon, but it most commonly affects the:  Shoulder tendon (rotator cuff).  Ankle tendon (Achilles tendon).  Elbow tendon (triceps tendon).  Tendons in the wrist. What are the causes? This condition may be caused by:  Overusing a tendon or muscle. This is common.  Age-related wear and tear.  Injury.  Inflammatory conditions, such as arthritis.  Certain medicines. What increases the risk? You are more likely to develop this condition if you do activities that involve the same  movements over and over again (repetitive motions). What are the signs or symptoms? Symptoms of this condition may include:  Pain.  Tenderness.  Mild swelling.  Decreased range of motion. How is this diagnosed? This condition is diagnosed with a physical exam. You may also have tests, such as:  Ultrasound. This uses  sound waves to make an image of the inside of your body in the affected area.  MRI. How is this treated? This condition may be treated by resting, icing, applying pressure (compression), and raising (elevating) the affected area above the level of your heart. This is known as RICE therapy. Treatment may also include:  Medicines to help reduce inflammation or to help reduce pain.  Exercises or physical therapy to strengthen and stretch the tendon.  A brace or splint.  Surgery. This is rarely needed. Follow these instructions at home: If you have a splint or brace:  Wear the splint or brace as told by your health care provider. Remove it only as told by your health care provider.  Loosen the splint or brace if your fingers or toes tingle, become numb, or turn cold and blue.  Keep the splint or brace clean.  If the splint or brace is not waterproof: ? Do not let it get wet. ? Cover it with a watertight covering when you take a bath or shower. Managing pain, stiffness, and swelling  If directed, put ice on the affected area. ? If you have a removable splint or brace, remove it as told by your health care provider. ? Put ice in a plastic bag. ? Place a towel between your skin and the bag. ? Leave the ice on for 20 minutes, 2-3 times a day.  Move the fingers or toes of the affected limb often, if this applies. This can help to prevent stiffness and lessen swelling.  If directed, raise (elevate) the affected area above the level of your heart while you are sitting or lying down.  If directed, apply heat to the affected area before you exercise. Use the heat  source that your health care provider recommends, such as a moist heat pack or a heating pad.     ? Place a towel between your skin and the heat source. ? Leave the heat on for 20-30 minutes. ? Remove the heat if your skin turns bright red. This is especially important if you are unable to feel pain, heat, or cold. You may have a greater risk of getting burned. Driving  Do not drive or use heavy machinery while taking prescription pain medicine.  Ask your health care provider when it is safe to drive if you have a splint or brace on any part of your arm or leg. Activity  Rest the affected area as told by your health care provider.  Return to your normal activities as told by your health care provider. Ask your health care provider what activities are safe for you.  Avoid using the affected area while you are experiencing symptoms of tendinitis.  Do exercises as told by your health care provider. General instructions  If you have a splint, do not put pressure on any part of the splint until it is fully hardened. This may take several hours.  Wear an elastic bandage or compression wrap only as told by your health care provider.  Take over-the-counter and prescription medicines only as told by your health care provider.  Keep all follow-up visits as told by your health care provider. This is important. Contact a health care provider if:  Your symptoms do not improve.  You develop new, unexplained problems, such as numbness in your hands. Summary  Tendinitis is inflammation of a tendon.  You are more likely to develop this condition if you do activities that involve the same movements over and over  again.  This condition may be treated by resting, icing, applying pressure (compression), and elevating the area above the level of your heart. This is known as RICE therapy.  Avoid using the affected area while you are experiencing symptoms of tendinitis. This information is not  intended to replace advice given to you by your health care provider. Make sure you discuss any questions you have with your health care provider. Document Released: 08/17/2000 Document Revised: 01/08/2018 Document Reviewed: 01/08/2018 Elsevier Interactive Patient Education  [employer]2019 [employer]    If you have lab work done today you will be contacted with your lab results within the next 2 weeks.  If you have not heard from us then please contact us. The fastest way to get your results is to register for My Chart.   IF you received an x-ray today, you will receive an invoice from [facility]. Please contact [facility] at [phone] with questions or concerns regarding your invoice.   IF you received labwork today, you will receive an invoice from [facility]. Please contact [facility] at [phone] with questions or concerns regarding your invoice.   Our billing staff will not be able to assist you with questions regarding bills from these companies.  You will be contacted with the lab results as soon as they are available. The fastest way to get your results is to activate your My Chart account. Instructions are located on the last page of this paperwork. If you have not heard from us regarding the results in 2 weeks, please contact this office.      Signed,   Meredith StaggersJeffrey Onie Kasparek, MD [facility].  02/19/19 12:12 AM

## 2019-02-18 NOTE — Patient Instructions (Addendum)
Hand pain appears to be extensor tendon tendinitis at the top of your hand.  That may be due to the relative rest then restart of repetitive activity.    Would recommend complete rest for the next 4 to 5 days, then restart at half activity if pain has improved. If pain persists, may need to rest longer, with slower return to activity.   Can take meloxicam 1 to 2/day as needed just do not combine with ibuprofen or other anti-inflammatories.  I will refer you to hand specialist, but if symptoms have resolved, we can cancel that referral.  Return to the clinic or go to the nearest emergency room if any of your symptoms worsen or new symptoms occur.   Tendinitis  Tendinitis is inflammation of a tendon. A tendon is a strong cord of tissue that connects muscle to bone. Tendinitis can affect any tendon, but it most commonly affects the:  Shoulder tendon (rotator cuff).  Ankle tendon (Achilles tendon).  Elbow tendon (triceps tendon).  Tendons in the wrist. What are the causes? This condition may be caused by:  Overusing a tendon or muscle. This is common.  Age-related wear and tear.  Injury.  Inflammatory conditions, such as arthritis.  Certain medicines. What increases the risk? You are more likely to develop this condition if you do activities that involve the same movements over and over again (repetitive motions). What are the signs or symptoms? Symptoms of this condition may include:  Pain.  Tenderness.  Mild swelling.  Decreased range of motion. How is this diagnosed? This condition is diagnosed with a physical exam. You may also have tests, such as:  Ultrasound. This uses sound waves to make an image of the inside of your body in the affected area.  MRI. How is this treated? This condition may be treated by resting, icing, applying pressure (compression), and raising (elevating) the affected area above the level of your heart. This is known as RICE therapy. Treatment  may also include:  Medicines to help reduce inflammation or to help reduce pain.  Exercises or physical therapy to strengthen and stretch the tendon.  A brace or splint.  Surgery. This is rarely needed. Follow these instructions at home: If you have a splint or brace:  Wear the splint or brace as told by your health care provider. Remove it only as told by your health care provider.  Loosen the splint or brace if your fingers or toes tingle, become numb, or turn cold and blue.  Keep the splint or brace clean.  If the splint or brace is not waterproof: ? Do not let it get wet. ? Cover it with a watertight covering when you take a bath or shower. Managing pain, stiffness, and swelling  If directed, put ice on the affected area. ? If you have a removable splint or brace, remove it as told by your health care provider. ? Put ice in a plastic bag. ? Place a towel between your skin and the bag. ? Leave the ice on for 20 minutes, 2-3 times a day.  Move the fingers or toes of the affected limb often, if this applies. This can help to prevent stiffness and lessen swelling.  If directed, raise (elevate) the affected area above the level of your heart while you are sitting or lying down.  If directed, apply heat to the affected area before you exercise. Use the heat source that your health care provider recommends, such as a moist heat pack or  a heating pad.     ? Place a towel between your skin and the heat source. ? Leave the heat on for 20-30 minutes. ? Remove the heat if your skin turns bright red. This is especially important if you are unable to feel pain, heat, or cold. You may have a greater risk of getting burned. Driving  Do not drive or use heavy machinery while taking prescription pain medicine.  Ask your health care provider when it is safe to drive if you have a splint or brace on any part of your arm or leg. Activity  Rest the affected area as told by your health  care provider.  Return to your normal activities as told by your health care provider. Ask your health care provider what activities are safe for you.  Avoid using the affected area while you are experiencing symptoms of tendinitis.  Do exercises as told by your health care provider. General instructions  If you have a splint, do not put pressure on any part of the splint until it is fully hardened. This may take several hours.  Wear an elastic bandage or compression wrap only as told by your health care provider.  Take over-the-counter and prescription medicines only as told by your health care provider.  Keep all follow-up visits as told by your health care provider. This is important. Contact a health care provider if:  Your symptoms do not improve.  You develop new, unexplained problems, such as numbness in your hands. Summary  Tendinitis is inflammation of a tendon.  You are more likely to develop this condition if you do activities that involve the same movements over and over again.  This condition may be treated by resting, icing, applying pressure (compression), and elevating the area above the level of your heart. This is known as RICE therapy.  Avoid using the affected area while you are experiencing symptoms of tendinitis. This information is not intended to replace advice given to you by your health care provider. Make sure you discuss any questions you have with your health care provider. Document Released: 08/17/2000 Document Revised: 01/08/2018 Document Reviewed: 01/08/2018 Elsevier Interactive Patient Education  [employer].    If you have lab work done today you will be contacted with your lab results within the next 2 weeks.  If you have not heard from [place] then please contact us. The fastest way to get your results is to register for My Chart.   IF you received an x-ray today, you will receive an invoice from [facility]. Please contact  [facility] at [phone] with questions or concerns regarding your invoice.   IF you received labwork today, you will receive an invoice from [facility]. Please contact [facility] at [phone] with questions or concerns regarding your invoice.   Our billing staff will not be able to assist you with questions regarding bills from these companies.  You will be contacted with the lab results as soon as they are available. The fastest way to get your results is to activate your My Chart account. Instructions are located on the last page of this paperwork. If you have not heard from [place] regarding the results in 2 weeks, please contact this office.
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
## 2022-06-14 ENCOUNTER — Emergency Department (HOSPITAL_COMMUNITY)
Admission: EM | Admit: 2022-06-14 | Discharge: 2022-06-14 | Disposition: A | Discharge status: Home / Self Care | Payer: No Typology Code available for payment source | Attending: Emergency Medicine | Admitting: Emergency Medicine

## 2022-06-14 ENCOUNTER — Other Ambulatory Visit: Payer: Self-pay

## 2022-06-14 ENCOUNTER — Emergency Department (HOSPITAL_COMMUNITY): Payer: No Typology Code available for payment source

## 2022-06-14 DIAGNOSIS — R0789 Other chest pain: Secondary | ICD-10-CM | POA: Diagnosis not present

## 2022-06-14 DIAGNOSIS — S1091XA Abrasion of unspecified part of neck, initial encounter: Secondary | ICD-10-CM | POA: Diagnosis not present

## 2022-06-14 DIAGNOSIS — S6992XA Unspecified injury of left wrist, hand and finger(s), initial encounter: Secondary | ICD-10-CM | POA: Diagnosis not present

## 2022-06-14 DIAGNOSIS — Y92410 Unspecified street and highway as the place of occurrence of the external cause: Secondary | ICD-10-CM | POA: Diagnosis not present

## 2022-06-14 DIAGNOSIS — S199XXA Unspecified injury of neck, initial encounter: Secondary | ICD-10-CM | POA: Diagnosis present

## 2022-06-14 LAB — I-STAT BETA HCG BLOOD, ED (MC, WL, AP ONLY): I-stat hCG, quantitative: <5 m[IU]/mL (ref <5)

## 2022-06-14 MED ORDER — OXYCODONE-ACETAMINOPHEN 5-325 MG PO TABS
1.0000 | ORAL_TABLET | ORAL | Status: DC | PRN
  Administered 2022-06-14: 1 via ORAL
  Filled 2022-06-14: qty 1
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
## 2023-01-30 ENCOUNTER — Encounter: Payer: Self-pay | Admitting: Family Medicine

## 2023-01-30 ENCOUNTER — Ambulatory Visit (INDEPENDENT_AMBULATORY_CARE_PROVIDER_SITE_OTHER): Payer: PRIVATE HEALTH INSURANCE | Admitting: Family Medicine

## 2023-01-30 VITALS — BP 110/80 | HR 82 | Temp 98.0°F | Resp 18 | Ht 67.5 in | Wt 250.5 lb

## 2023-01-30 DIAGNOSIS — F902 Attention-deficit hyperactivity disorder, combined type: Secondary | ICD-10-CM | POA: Insufficient documentation

## 2023-01-30 DIAGNOSIS — G43009 Migraine without aura, not intractable, without status migrainosus: Secondary | ICD-10-CM | POA: Insufficient documentation

## 2023-01-30 DIAGNOSIS — F32A Depression, unspecified: Secondary | ICD-10-CM

## 2023-01-30 DIAGNOSIS — F419 Anxiety disorder, unspecified: Secondary | ICD-10-CM | POA: Diagnosis not present

## 2023-01-30 MED ORDER — HYDROXYZINE PAMOATE 25 MG PO CAPS: 25.0000 mg | ORAL_CAPSULE | Freq: Three times a day (TID) | ORAL | 1 refills | Status: DC | PRN

## 2023-01-30 NOTE — Assessment & Plan Note (Signed)
Chronic.  Doing well on qulipta

## 2023-01-30 NOTE — Assessment & Plan Note (Signed)
Chronic.  Better on Vyvanse but she will discuss with wellness clinic doctor about dose adjustment  prescription drug monitoring program checked

## 2023-01-30 NOTE — Patient Instructions (Signed)
Welcome to [facility] at [facility]! It was a pleasure meeting you today.  As discussed, Please schedule a 1 month follow up visit today.  PLEASE NOTE:  If you had any LAB tests please let us know if you have not heard back within a few days. You may see your results on MyChart before we have a chance to review them but we will give you a call once they are reviewed by us. If we ordered any REFERRALS today, please let us know if you have not heard from their office within the next week.  Let us know through MyChart if you are needing REFILLS, or have your pharmacy send us the request. You can also use MyChart to communicate with me or any office staff.  Please try these tips to maintain a healthy lifestyle:  Eat most of your calories during the day when you are active. Eliminate processed foods including packaged sweets (pies, cakes, cookies), reduce intake of potatoes, white bread, white pasta, and white rice. Look for whole grain options, oat flour or almond flour.  Each meal should contain half fruits/vegetables, one quarter protein, and one quarter carbs (no bigger than a computer mouse).  Cut down on sweet beverages. This includes juice, soda, and sweet tea. Also watch fruit intake, though this is a healthier sweet option, it still contains natural sugar! Limit to 3 servings daily.  Drink at least 1 glass of water with each meal and aim for at least 8 glasses per day  Exercise at least 150 minutes every week.   
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
## 2023-01-30 NOTE — Assessment & Plan Note (Signed)
Chronic.  Fair control on Prozac 80 mg daily.  Will do genesight testing.  Discussed changing to another SSRI and adding Wellbutrin.  Will await testing.  Patient on high dose Prozac.  Some PTSD as well-will trial hydroxyzine 25 mg daily for anxiety.  Consider BuSpar.  Advised against benzos.

## 2023-02-27 ENCOUNTER — Encounter: Payer: Self-pay | Admitting: Family Medicine

## 2023-03-13 ENCOUNTER — Ambulatory Visit (INDEPENDENT_AMBULATORY_CARE_PROVIDER_SITE_OTHER): Payer: PRIVATE HEALTH INSURANCE | Admitting: Family Medicine

## 2023-03-13 ENCOUNTER — Encounter: Payer: Self-pay | Admitting: Family Medicine

## 2023-03-13 VITALS — BP 112/80 | HR 66 | Temp 97.3°F | Resp 18 | Ht 67.5 in | Wt 248.5 lb

## 2023-03-13 DIAGNOSIS — F419 Anxiety disorder, unspecified: Secondary | ICD-10-CM

## 2023-03-13 DIAGNOSIS — F32A Depression, unspecified: Secondary | ICD-10-CM | POA: Diagnosis not present

## 2023-03-13 MED ORDER — DESVENLAFAXINE SUCCINATE ER 100 MG PO TB24: 100.0000 mg | ORAL_TABLET | Freq: Every day | ORAL | 1 refills | Status: DC

## 2023-03-13 NOTE — Patient Instructions (Signed)
Folic acid 1000.   Swap prozac for pristiq
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
## 2023-03-13 NOTE — Assessment & Plan Note (Signed)
Chronic.  Not ideal.  Based on gene-sight testing, will change prozac to pristiq 100mg  daily

## 2023-03-18 ENCOUNTER — Encounter: Payer: Self-pay | Admitting: Family Medicine

## 2023-04-03 ENCOUNTER — Encounter (INDEPENDENT_AMBULATORY_CARE_PROVIDER_SITE_OTHER): Payer: Self-pay

## 2023-04-04 ENCOUNTER — Other Ambulatory Visit: Payer: Self-pay | Admitting: Family Medicine

## 2023-04-04 DIAGNOSIS — F32A Depression, unspecified: Secondary | ICD-10-CM

## 2023-04-24 ENCOUNTER — Ambulatory Visit (INDEPENDENT_AMBULATORY_CARE_PROVIDER_SITE_OTHER): Payer: PRIVATE HEALTH INSURANCE | Admitting: Family Medicine

## 2023-04-24 ENCOUNTER — Encounter: Payer: Self-pay | Admitting: Family Medicine

## 2023-04-24 VITALS — BP 112/76 | HR 67 | Temp 98.0°F | Resp 18 | Ht 67.5 in | Wt 252.5 lb

## 2023-04-24 DIAGNOSIS — Z1159 Encounter for screening for other viral diseases: Secondary | ICD-10-CM

## 2023-04-24 DIAGNOSIS — F32A Depression, unspecified: Secondary | ICD-10-CM

## 2023-04-24 DIAGNOSIS — F419 Anxiety disorder, unspecified: Secondary | ICD-10-CM

## 2023-04-24 DIAGNOSIS — Z0000 Encounter for general adult medical examination without abnormal findings: Secondary | ICD-10-CM | POA: Diagnosis not present

## 2023-04-24 LAB — CBC WITH DIFFERENTIAL/PLATELET
Basophils Absolute: 0.1 10*3/uL (ref 0.0–0.1)
Basophils Relative: 0.8 % (ref 0.0–3.0)
Eosinophils Absolute: 0.2 10*3/uL (ref 0.0–0.7)
Eosinophils Relative: 2.3 % (ref 0.0–5.0)
HCT: 38.9 % (ref 36.0–46.0)
Hemoglobin: 12.6 g/dL (ref 12.0–15.0)
Lymphocytes Relative: 28.9 % (ref 12.0–46.0)
Lymphs Abs: 1.9 10*3/uL (ref 0.7–4.0)
MCHC: 32.4 g/dL (ref 30.0–36.0)
MCV: 83.6 fl (ref 78.0–100.0)
Monocytes Absolute: 0.3 10*3/uL (ref 0.1–1.0)
Monocytes Relative: 4.9 % (ref 3.0–12.0)
Neutro Abs: 4.2 10*3/uL (ref 1.4–7.7)
Neutrophils Relative %: 63.1 % (ref 43.0–77.0)
Platelets: 309 10*3/uL (ref 150.0–400.0)
RBC: 4.65 Mil/uL (ref 3.87–5.11)
RDW: 15.8 % — ABNORMAL HIGH (ref 11.5–15.5)
WBC: 6.7 10*3/uL (ref 4.0–10.5)

## 2023-04-24 LAB — COMPREHENSIVE METABOLIC PANEL
ALT: 13 U/L (ref 0–35)
AST: 15 U/L (ref 0–37)
Albumin: 4.1 g/dL (ref 3.5–5.2)
Alkaline Phosphatase: 61 U/L (ref 39–117)
BUN: 11 mg/dL (ref 6–23)
CO2: 27 mEq/L (ref 19–32)
Calcium: 8.9 mg/dL (ref 8.4–10.5)
Chloride: 104 meq/L (ref 96–112)
Creatinine, Ser: 0.81 mg/dL (ref 0.40–1.20)
GFR: 91.13 mL/min (ref >60.00)
Glucose, Bld: 99 mg/dL (ref 70–99)
Potassium: 4 meq/L (ref 3.5–5.1)
Sodium: 138 meq/L (ref 135–145)
Total Bilirubin: 0.4 mg/dL (ref 0.2–1.2)
Total Protein: 6.9 g/dL (ref 6.0–8.3)

## 2023-04-24 LAB — HEMOGLOBIN A1C: Hgb A1c MFr Bld: 5.5 % (ref 4.6–6.5)

## 2023-04-24 LAB — LIPID PANEL
Cholesterol: 232 mg/dL — ABNORMAL HIGH (ref 0–200)
HDL: 44.9 mg/dL (ref >39.00)
LDL Cholesterol: 168 mg/dL — ABNORMAL HIGH (ref 0–99)
NonHDL: 187.3
Total CHOL/HDL Ratio: 5
Triglycerides: 98 mg/dL (ref 0.0–149.0)
VLDL: 19.6 mg/dL (ref 0.0–40.0)

## 2023-04-24 MED ORDER — DESVENLAFAXINE SUCCINATE ER 100 MG PO TB24: 100.0000 mg | ORAL_TABLET | Freq: Every day | ORAL | 1 refills | Status: DC

## 2023-04-24 NOTE — Patient Instructions (Signed)

## 2023-04-24 NOTE — Progress Notes (Signed)
Phone [phone]   Subjective:   Patient is a 42 y.o. female presenting for annual physical.    Chief Complaint  Patient presents with   Annual Exam    CPE Fasting Left knee injury on yesterday Will schedule pap with gyn   HPI   Annual - still smoking. Getting pap at gyn  Anxiety/Depression - Taking Pristiq 100 mg daily, and trazodone 50 mg. She is tolerating Pristiq well and she states has helped her significantly. Follows up with psychiatrist. No SI.   ADHD - Taking Vyvanse  20 mg.   Knee injury - She reports a left knee injury yesterday after a fall. She states her leg fell asleep while taking a nap in a chair, when she woke up she stood up too fast and injured her knee(leg was asleep). She is having some difficulty walking on the knee. She endorses some tenderness and pain when walking up stairs and bending the knee. Has brace at home, hasn't used it yet.    See problem oriented charting- ROS- ROS: Gen: no fever, chills  Skin: no rash, itching ENT: no ear pain, ear drainage, nasal congestion, rhinorrhea, sinus pressure, sore throat Eyes: no blurry vision, double vision Resp: no cough, wheeze,SOB CV: no CP, palpitations, LE edema,  GI: no heartburn, n/v/d/c, abd pain GU: no dysuria, urgency, frequency, hematuria MSK: no myalgias or back pain Neuro: no dizziness, headache, weakness, vertigo Psych: no insomnia, or SI   The following were reviewed and entered/updated in epic: Past Medical History:  Diagnosis Date   ADHD    Allergy    Anxiety    Arthritis    Depression    Migraine headache without aura    Sleep apnea    has cpap   Patient Active Problem List   Diagnosis Date Noted   Anxiety and depression 01/30/2023   Attention deficit hyperactivity disorder (ADHD), combined type 01/30/2023   Migraine without aura and without status migrainosus, not intractable 01/30/2023   Past Surgical History:  Procedure Laterality Date   MOUTH SURGERY     at age 78     Family History  Problem Relation Age of Onset   Hyperlipidemia Mother    Depression Mother    Arthritis Mother    Alcohol abuse Mother    Stroke Father 61 - 21   Alcohol abuse Father    Arthritis Brother     Medications- reviewed and updated Current Outpatient Medications  Medication Sig Dispense Refill   Ferrous Sulfate (IRON PO) Take 325 mg by mouth daily.     hydrOXYzine (VISTARIL) 25 MG capsule Take 1 capsule (25 mg total) by mouth every 8 (eight) hours as needed. 30 capsule 1   ibuprofen (ADVIL,MOTRIN) 200 MG tablet Take 400 mg by mouth every 6 (six) hours as needed for moderate pain.     lisdexamfetamine (VYVANSE) 30 MG capsule Take 1 capsule by mouth once daily in the morning. (Also takes 20mg  in afternoon). for 30 days     Multiple Vitamins-Minerals (CENTRUM ULTRA WOMENS PO) Take by mouth daily.     ondansetron (ZOFRAN) 4 MG tablet Take 4 mg by mouth every 8 (eight) hours as needed.     pantoprazole (PROTONIX) 20 MG tablet Take 20 mg by mouth daily.     Probiotic Product (PROBIOTIC PO) Take 250 mg by mouth daily.     QULIPTA 60 MG TABS Take 1 tablet by mouth daily.     traZODone (DESYREL) 50 MG tablet Take 50 mg  by mouth at bedtime as needed for sleep.     VYVANSE 20 MG capsule Take 20 mg by mouth every morning.     desvenlafaxine (PRISTIQ) 100 MG 24 hr tablet Take 1 tablet (100 mg total) by mouth daily. 90 tablet 1   No current facility-administered medications for this visit.    Allergies-reviewed and updated Allergies  Allergen Reactions   Coconut Oil Itching    vomiting    Social History   Social History Narrative   Not on file   Objective  Objective:  BP 112/76   Pulse 67   Temp 98 F (36.7 C) (Temporal)   Resp 18   Ht 5' 7.5" (1.715 m)   Wt 252 lb 8 oz (114.5 kg)   LMP 04/01/2023 (Approximate)   SpO2 99%   BMI 38.96 kg/m  Physical Exam  Gen: WDWN NAD HEENT: NCAT, conjunctiva not injected, sclera nonicteric TM WNL B, OP moist, no exudates   NECK:  supple, no thyromegaly, no nodes, no carotid bruits CARDIAC: RRR, S1S2+, no murmur. DP 2+B LUNGS: CTAB. No wheezes ABDOMEN:  BS+, soft, NTND, No HSM, no masses EXT:  no edema MSK: no gross abnormalities. MS 5/5 all 4 NEURO: A&O x3.  CN II-XII intact.  PSYCH: normal mood. Good eye contact   Knee exam: No deformity on inspection. +Mild pain with palpation of lateral knee landmarks  No effusion/swelling noted. FROM in flex/extension without crepitus. No popliteal fullness. Neg drawer test. Neg mcmurray test. +Slight pain with valgus/varus stress. No PFgrind. No abnormal patellar mobility. +Slightly tender with flex extension    Assessment and Plan   Health Maintenance counseling: 1. Anticipatory guidance: Patient counseled regarding regular dental exams q6 months, eye exams,  avoiding smoking and second hand smoke, limiting alcohol to 1 beverage per day, no illicit drugs.   2. Risk factor reduction:  Advised patient of need for regular exercise and diet rich and fruits and vegetables to reduce risk of heart attack and stroke. Exercise- increase.  Wt Readings from Last 3 Encounters:  04/24/23 252 lb 8 oz (114.5 kg)  03/13/23 248 lb 8 oz (112.7 kg)  01/30/23 250 lb 8 oz (113.6 kg)   3. Immunizations/screenings/ancillary studies Immunization History  Administered Date(s) Administered   PFIZER(Purple Top)SARS-COV-2 Vaccination 01/23/2020, 02/13/2020   Tdap 02/18/2019   Health Maintenance Due  Topic Date Due   HIV Screening  Never done   Hepatitis C Screening  Never done   PAP SMEAR-Modifier  Never done    4. Cervical cancer screening: Due for next pap, will schedule with gyn.  5. Skin cancer screening- advised regular sunscreen use. Denies worrisome, changing, or new skin lesions.  6. Birth control/STD check: n/a 7. Smoking associated screening: current smoker.  8. Alcohol screening:   Wellness examination -     CBC with Differential/Platelet -     Comprehensive  metabolic panel -     Lipid panel -     TSH -     Hemoglobin A1c -     Hepatitis C antibody -     HIV Antibody (routine testing w rflx)  Anxiety and depression -     Desvenlafaxine Succinate ER; Take 1 tablet (100 mg total) by mouth daily.  Dispense: 90 tablet; Refill: 1  Screening for viral disease -     Hepatitis C antibody -     HIV Antibody (routine testing w rflx)   Wellness-anticipatory guidance.  Work on Diet/Exercise  Check CBC,CMP,lipids,TSH, A1C.  F/u  1 yr  2.  Anxiety/depression-doing much better on pristiq 100mg  3.  L knee pain after injury yesterday-ice, compression.  Worse, see ortho/sports med  Recommended follow up: Return in about 6 months (around 10/25/2023) for mood.  Lab/Order associations: + fasting   I,Rachel Rivera,acting as a scribe for Angelena Sole, MD.,have documented all relevant documentation on the behalf of Angelena Sole, MD,as directed by  Angelena Sole, MD while in the presence of Angelena Sole, MD.  I, Angelena Sole, MD, have reviewed all documentation for this visit. The documentation on 04/24/23 for the exam, diagnosis, procedures, and orders are all accurate and complete.     Angelena Sole, MD

## 2023-04-25 LAB — HIV ANTIBODY (ROUTINE TESTING W REFLEX): HIV 1&2 Ab, 4th Generation: NONREACTIVE

## 2023-04-25 LAB — HEPATITIS C ANTIBODY: Hepatitis C Ab: NONREACTIVE

## 2023-04-26 LAB — TSH: TSH: 0.93 u[IU]/mL (ref 0.35–5.50)

## 2023-04-28 NOTE — Progress Notes (Signed)
Labs are great except: 1.  Your cholesterol levels are elevated.  Work on low cholesterol and lower carbs/sugars diet and  get exercise to try to lower your cholesterol.

## 2023-09-09 ENCOUNTER — Encounter: Payer: Self-pay | Admitting: Family Medicine

## 2023-09-09 ENCOUNTER — Ambulatory Visit: Payer: Self-pay | Admitting: Family Medicine

## 2023-09-09 ENCOUNTER — Ambulatory Visit
Admission: EM | Admit: 2023-09-09 | Discharge: 2023-09-09 | Disposition: A | Discharge status: Home / Self Care | Payer: PRIVATE HEALTH INSURANCE | Attending: Family Medicine | Admitting: Family Medicine

## 2023-09-09 DIAGNOSIS — L309 Dermatitis, unspecified: Secondary | ICD-10-CM

## 2023-09-09 MED ORDER — PREDNISONE 10 MG (21) PO TBPK: ORAL_TABLET | ORAL | 0 refills | Status: DC

## 2023-09-09 MED ORDER — TRIAMCINOLONE ACETONIDE 0.1 % EX CREA: 1.0000 | TOPICAL_CREAM | Freq: Two times a day (BID) | CUTANEOUS | 0 refills | Status: DC

## 2023-09-09 NOTE — ED Triage Notes (Signed)
 Pt presents with complaints of all over body itching x 3 days. Pt denies any new shampoos, body washes, and/or detergents. Only new medication introduced was eye drops for pink eye approximately two weeks ago. Pt currently rates pain a 0/10, voices discomfort/burning of the skin. Benadryl taken last night, in addition to oatmeal baths, Aveeno body scrubs, and OTC hydrocortisone & eczema relief cream with no relief/improvement.

## 2023-09-09 NOTE — ED Provider Notes (Signed)
 [facility]    CSN: [id number] Arrival date & time: 09/09/23  1621      History   Chief Complaint Chief Complaint  Patient presents with   Pruritis    HPI Sherri Frey is a 43 y.o. female.   HPI Itchy, onset 3 days ago worse on hands, forearms, ankles and back.  Admits to rash on her hands.  She works as a international aid/development worker.  Denies new medications, over-the-counter supplements, change in hygiene products or products that she uses at work.  Denies known exposure, or contacts with similar rash.  Has been using Benadryl and over-the-counter hydrocortisone cream without relief.  Denies fever, chills, sweats, rhinorrhea, nasal  Past Medical History:  Diagnosis Date   ADHD    Allergy    Anxiety    Arthritis    Depression    Migraine headache without aura    Sleep apnea    has cpap    Patient Active Problem List   Diagnosis Date Noted   Anxiety and depression 01/30/2023   Attention deficit hyperactivity disorder (ADHD), combined type 01/30/2023   Migraine without aura and without status migrainosus, not intractable 01/30/2023    Past Surgical History:  Procedure Laterality Date   MOUTH SURGERY     at age 3    OB History   No obstetric history on file.      Home Medications    Prior to Admission medications   Medication Sig Start Date End Date Taking? Authorizing Provider  predniSONE  (STERAPRED UNI-PAK 21 TAB) 10 MG (21) TBPK tablet Take as directed 09/09/23  Yes Trystan Eads, PA  triamcinolone  cream (KENALOG ) 0.1 % Apply 1 Application topically 2 (two) times daily. 09/09/23  Yes Kareema Keitt, PA  desvenlafaxine  (PRISTIQ ) 100 MG 24 hr tablet Take 1 tablet (100 mg total) by mouth daily. 04/24/23   Wendolyn Jenkins Jansky, MD  Ferrous Sulfate (IRON PO) Take 325 mg by mouth daily.    [provider]  hydrOXYzine  (VISTARIL ) 25 MG capsule Take 1 capsule (25 mg total) by mouth every 8 (eight) hours as needed. 01/30/23   Wendolyn Jenkins Jansky, MD  ibuprofen (ADVIL,MOTRIN)  200 MG tablet Take 400 mg by mouth every 6 (six) hours as needed for moderate pain.    [provider]  lisdexamfetamine (VYVANSE ) 30 MG capsule Take 1 capsule by mouth once daily in the morning. (Also takes 20mg  in afternoon). for 30 days 03/13/23   [provider]  Multiple Vitamins-Minerals (CENTRUM ULTRA WOMENS PO) Take by mouth daily.    [provider]  ondansetron (ZOFRAN) 4 MG tablet Take 4 mg by mouth every 8 (eight) hours as needed. 08/15/22   [provider]  pantoprazole (PROTONIX) 20 MG tablet Take 20 mg by mouth daily.    [provider]  Probiotic Product (PROBIOTIC PO) Take 250 mg by mouth daily.    [provider]  QULIPTA  60 MG TABS Take 1 tablet by mouth daily. 03/06/23   [provider]  traZODone (DESYREL) 50 MG tablet Take 50 mg by mouth at bedtime as needed for sleep.    [provider]  VYVANSE  20 MG capsule Take 20 mg by mouth every morning. 01/16/23   [provider]    Family History Family History  Problem Relation Age of Onset   Hyperlipidemia Mother    Depression Mother    Arthritis Mother    Alcohol abuse Mother    Stroke Father 63 - 33   Alcohol  abuse Father    Arthritis Brother     Social History Social History   Tobacco Use   Smoking status: Some Days    Types: Cigarettes, E-cigarettes   Smokeless tobacco: Never  Vaping Use   Vaping status: Every Day  Substance Use Topics   Alcohol use: Yes    Comment: social-monthly   Drug use: Yes    Types: Marijuana     Allergies   Coconut oil   Review of Systems Review of Systems  Constitutional:  Negative for fatigue and fever.  HENT:  Negative for congestion, rhinorrhea and sore throat.   Respiratory:  Negative for cough and shortness of breath.   Musculoskeletal:  Negative for arthralgias.  Skin:  Positive for rash. Negative for color change.     Physical Exam Triage Vital Signs ED Triage Vitals  Encounter  Vitals Group     BP 09/09/23 1635 122/85     Systolic BP Percentile --      Diastolic BP Percentile --      Pulse Rate 09/09/23 1635 98     Resp 09/09/23 1635 18     Temp 09/09/23 1635 (!) 97.5 F (36.4 C)     Temp Source 09/09/23 1635 Oral     SpO2 09/09/23 1635 97 %     Weight 09/09/23 1634 240 lb (108.9 kg)     Height 09/09/23 1634 5' 7 (1.702 m)     Head Circumference --      Peak Flow --      Pain Score 09/09/23 1634 0     Pain Loc --      Pain Education --      Exclude from Growth Chart --    No data found.  Updated Vital Signs BP 122/85 (BP Location: Right Arm)   Pulse 98   Temp (!) 97.5 F (36.4 C) (Oral)   Resp 18   Ht 5' 7 (1.702 m)   Wt 240 lb (108.9 kg)   LMP 08/23/2023 (Exact Date)   SpO2 97%   BMI 37.59 kg/m   Visual Acuity Right Eye Distance:   Left Eye Distance:   Bilateral Distance:    Right Eye Near:   Left Eye Near:    Bilateral Near:     Physical Exam Vitals and nursing note reviewed.  Constitutional:      Appearance: She is not ill-appearing.  HENT:     Head: Normocephalic and atraumatic.     Mouth/Throat:     Mouth: Mucous membranes are moist.  Eyes:     Conjunctiva/sclera: Conjunctivae normal.  Cardiovascular:     Rate and Rhythm: Normal rate.  Pulmonary:     Effort: Pulmonary effort is normal.     Breath sounds: Normal breath sounds.  Musculoskeletal:     Cervical back: Neck supple.  Skin:    General: Skin is warm and dry.     Findings: Rash (Excoriated papular rash noted on dorsal hands, macular rash on back) present.     Comments: Does not affect webspaces   Neurological:     Mental Status: She is alert.      UC Treatments / Results  Labs (all labs ordered are listed, but only abnormal results are displayed) Labs Reviewed - No data to display  EKG   Radiology No results found.  Procedures Procedures (including critical care time)  Medications Ordered in UC Medications - No data to display  Initial  Impression / Assessment and Plan / UC  Course  I have reviewed the triage vital signs and the nursing notes.  Pertinent labs & imaging results that were available during my care of the patient were reviewed by me and considered in my medical decision making (see chart for details).     43 year old female with itchy rash for several days no known exposures or medications.  Papular excoriated rash dorsal hands, macular rash on her back.  Do not suspect scabies, likely contact dermatitis.  Will treat with oral steroid Dosepak, patient is requesting a topical as well, she was counseled to take over-the-counter Zyrtec 1 tablet twice daily.  Follow-up if symptoms fail to improve or development of new symptoms Final Clinical Impressions(s) / UC Diagnoses   Final diagnoses:  Dermatitis     Discharge Instructions      Over-the-counter Zyrtec 1 tablet twice daily as needed for itching   ED Prescriptions     Medication Sig Dispense Auth. Provider   predniSONE  (STERAPRED UNI-PAK 21 TAB) 10 MG (21) TBPK tablet Take as directed 21 tablet Yukio Bisping, PA   triamcinolone  cream (KENALOG ) 0.1 % Apply 1 Application topically 2 (two) times daily. 30 g Maryln Eastham, [place]      PDMP not reviewed this encounter.   Daveena Elmore, [place] 09/09/23 1652

## 2023-09-09 NOTE — Telephone Encounter (Signed)
  Chief Complaint: rash to hands, arms and ankles Symptoms: itching Frequency: Since 09/07/2023 Pertinent Negatives: Patient denies fever, abdominal pain, SOB, tongue swelling Disposition: [] ED /[x] Urgent Care (no appt availability in office) / [] Appointment(In office/virtual)/ []  [facility]/ [] Home Care/ [] Refused Recommended Disposition /[] [facility]/ []  Follow-up with PCP Additional Notes: patient c/o rash and itching to bilateral hands, arms and ankles since Saturday 09/07/2023. Patient is concerned that she has developed Shingles. Per protocol, the recommendation is urgent care as patient is requesting to see her PCP today but there is no availability in the office. Patient verbalized understanding of plan and all questions answered.   Copied from CRM [id number]. Topic: Appointments - Appointment Scheduling >> Sep 09, 2023  3:30 PM Geneva B wrote: Patient/patient representative is calling to schedule an appointment. Patient thinks she has shingles Refer to attachments for appointment information. Reason for Disposition  [1] MODERATE-SEVERE hives persist (i.e., hives interfere with normal activities or work) AND [2] taking antihistamine (e.g., Benadryl, Claritin) > 24 hours  Answer Assessment - Initial Assessment Questions 1. DESCRIPTION: Describe the itching you are having.     Continuous itching to bilateral hands, arms and ankles-looks like hives 2. SEVERITY: How bad is it?    - MILD: Doesn't interfere with normal activities.   - MODERATE-SEVERE: Interferes with work, school, sleep, or other activities.      Moderate-severe 3. SCRATCHING: Are there any scratch marks? Bleeding?     No 4. ONSET: When did this begin?      Saturday 5. CAUSE: What do you think is causing the itching? (ask about swimming pools, pollen, animals, soaps, etc.)     Thinks she might have shingles 6. OTHER SYMPTOMS: Do you have any other symptoms?      No other symptoms 7.  PREGNANCY: Is there any chance you are pregnant? When was your last menstrual period?     No  Answer Assessment - Initial Assessment Questions 1. APPEARANCE: What does the rash look like?      Red areas 2. LOCATION: Where is the rash located?      Hands, arms and ankles 3. NUMBER: How many hives are there?      several 4. SIZE: How big are the hives? (inches, cm, compare to coins) Do they all look the same or is there lots of variation in shape and size?      Different sizes 5. ONSET: When did the hives begin? (Hours or days ago)      Saturday 6. ITCHING: Does it itch? If Yes, ask: How bad is the itch?    - MILD: doesn't interfere with normal activities   - MODERATE-SEVERE: interferes with work, school, sleep, or other activities      Moderate-Severe 7. RECURRENT PROBLEM: Have you had hives before? If Yes, ask: When was the last time? and What happened that time?      No 8. TRIGGERS: Were you exposed to any new food, plant, cosmetic product or animal just before the hives began?     No 9. OTHER SYMPTOMS: Do you have any other symptoms? (e.g., fever, tongue swelling, difficulty breathing, abdomen pain)     No 10. PREGNANCY: Is there any chance you are pregnant? When was your last menstrual period?       No  Protocols used: Itching - Widespread-A-AH, Hives-A-AH

## 2023-09-09 NOTE — Discharge Instructions (Signed)
 Over-the-counter Zyrtec 1 tablet twice daily as needed for itching

## 2023-10-20 ENCOUNTER — Other Ambulatory Visit: Payer: Self-pay | Admitting: Family Medicine

## 2023-10-20 DIAGNOSIS — F419 Anxiety disorder, unspecified: Secondary | ICD-10-CM

## 2023-10-30 ENCOUNTER — Ambulatory Visit: Payer: No Typology Code available for payment source | Admitting: Family Medicine

## 2023-10-30 ENCOUNTER — Encounter: Payer: Self-pay | Admitting: Family Medicine

## 2023-10-30 VITALS — BP 111/72 | HR 82 | Temp 98.2°F | Resp 18 | Ht 67.0 in | Wt 253.1 lb

## 2023-10-30 DIAGNOSIS — G43009 Migraine without aura, not intractable, without status migrainosus: Secondary | ICD-10-CM

## 2023-10-30 DIAGNOSIS — F419 Anxiety disorder, unspecified: Secondary | ICD-10-CM

## 2023-10-30 DIAGNOSIS — F902 Attention-deficit hyperactivity disorder, combined type: Secondary | ICD-10-CM

## 2023-10-30 DIAGNOSIS — F32A Depression, unspecified: Secondary | ICD-10-CM | POA: Diagnosis not present

## 2023-10-30 MED ORDER — DESVENLAFAXINE SUCCINATE ER 100 MG PO TB24: 100.0000 mg | ORAL_TABLET | Freq: Every day | ORAL | 1 refills | Status: DC

## 2023-10-30 NOTE — Patient Instructions (Signed)

## 2023-10-30 NOTE — Progress Notes (Signed)
 Subjective:     Patient ID: Sherri Frey, female    DOB: 1981-05-10, 43 y.o.   MRN: [id number]  Chief Complaint  Patient presents with   Medical Management of Chronic Issues    6 month follow-up Discuss weight loss options    HPI Discussed the use of AI scribe software for clinical note transcription with the patient, who gave verbal consent to proceed.  History of Present Illness   Sherri Flax "[name]" is a 43 year old female who presents for a follow-up regarding weight loss management.  She has been increasing her physical activity and monitoring her diet but is concerned that stress may be hindering her weight loss efforts. Her insurance has denied coverage for weight loss medications such as Wegovy and Zepbound. She feels she has reached a plateau in her weight loss journey and is seeking additional support. goes to weight and wellness clinic.    She has a history of prediabetes and notes that her A1c has improved with weight loss. She is concerned about maintaining her current status and continues to focus on lifestyle modifications.  She is currently taking Vyvanse, 30 mg in the morning and 20 mg in the afternoon, which helps decrease appetite but plateaued. She is also taking Qulipta, which aids in weight loss and migraines. Her wellness doctor is considering reducing the dosage.   depression/anxiety-doing very well on pristiq 100mg  daily.  no SI.   Gets irritability when missing doses.  Her daily routine includes trying to eat breakfast before taking her medications to avoid nausea, often having a protein shake or smoothie if she cannot eat a solid breakfast. She does not usually take a lunch break at work but tries to snack throughout the day to maintain her metabolism. She has dinner with her family in the evening.  No current issues with sleep or panic attacks. She has only used hydroxyzine twice for these issues. She mentions having a new puppy, which has been a positive  addition to her life.       Health Maintenance Due  Topic Date Due   Cervical Cancer Screening (HPV/Pap Cotest)  Never done    Past Medical History:  Diagnosis Date   ADHD    Allergy    Anxiety    Arthritis    Depression    Migraine headache without aura    Sleep apnea    has cpap    Past Surgical History:  Procedure Laterality Date   MOUTH SURGERY     at age 10     Current Outpatient Medications:    cetirizine (ZYRTEC ALLERGY) 10 MG tablet, 1 tablet Orally Once a day, Disp: , Rfl:    Ferrous Sulfate (IRON) 325 (65 Fe) MG TABS, 1 tablet Orally at least Three times a Week. (Tries to take daily.), Disp: , Rfl:    hydrochlorothiazide (HYDRODIURIL) 25 MG tablet, TAKE 1 TABLET BY MOUTH EVERY DAY for 30, Disp: , Rfl:    hydrOXYzine (VISTARIL) 25 MG capsule, Take 1 capsule (25 mg total) by mouth every 8 (eight) hours as needed., Disp: 30 capsule, Rfl: 1   ibuprofen (ADVIL,MOTRIN) 200 MG tablet, Take 400 mg by mouth every 6 (six) hours as needed for moderate pain., Disp: , Rfl:    lisdexamfetamine (VYVANSE) 30 MG capsule, Take 1 capsule by mouth once daily in the morning. (Also takes 20mg  in afternoon). for 30 days, Disp: , Rfl:    Multiple Vitamins-Minerals (CENTRUM WOMEN) TABS, Take 1 tablet by mouth  daily., Disp: , Rfl:    ondansetron (ZOFRAN) 4 MG tablet, Take 4 mg by mouth every 8 (eight) hours as needed., Disp: , Rfl:    pantoprazole (PROTONIX) 20 MG tablet, Take 20 mg by mouth every 3 (three) days., Disp: , Rfl:    QULIPTA 60 MG TABS, Take 1 tablet by mouth daily., Disp: , Rfl:    saccharomyces boulardii (FLORASTOR) 250 MG capsule, Take 1 capsule by mouth daily., Disp: , Rfl:    traZODone (DESYREL) 50 MG tablet, Take 50 mg by mouth at bedtime as needed for sleep., Disp: , Rfl:    triamcinolone cream (KENALOG) 0.1 %, Apply 1 Application topically 2 (two) times daily., Disp: 30 g, Rfl: 0   VYVANSE 20 MG capsule, Take 20 mg by mouth every morning., Disp: , Rfl:    desvenlafaxine  (PRISTIQ) 100 MG 24 hr tablet, Take 1 tablet (100 mg total) by mouth daily., Disp: 90 tablet, Rfl: 1  Allergies  Allergen Reactions   Coconut Oil Itching and Anaphylaxis    vomiting   Semaglutide     Other Reaction(s): pancreatitis   ROS neg/noncontributory except as noted HPI/below      Objective:     BP 111/72   Pulse 82   Temp 98.2 F (36.8 C) (Temporal)   Resp 18   Ht 5\' 7"  (1.702 m)   Wt 253 lb 2 oz (114.8 kg)   LMP 10/19/2023 (Approximate)   SpO2 98%   BMI 39.64 kg/m  Wt Readings from Last 3 Encounters:  10/30/23 253 lb 2 oz (114.8 kg)  09/09/23 240 lb (108.9 kg)  04/24/23 252 lb 8 oz (114.5 kg)    Physical Exam   Gen: WDWN NAD HEENT: NCAT, conjunctiva not injected, sclera nonicteric NECK:  supple, no thyromegaly, no nodes, no carotid bruits CARDIAC: RRR, S1S2+, no murmur.  LUNGS: CTAB. No wheezes EXT:  no edema MSK: no gross abnormalities.  NEURO: A&O x3.  CN II-XII intact.  PSYCH: normal mood. Good eye contact     Assessment & Plan:  Anxiety and depression -     Desvenlafaxine Succinate ER; Take 1 tablet (100 mg total) by mouth daily.  Dispense: 90 tablet; Refill: 1  Attention deficit hyperactivity disorder (ADHD), combined type  Migraine without aura and without status migrainosus, not intractable  Discussed the use of AI scribe software for clinical note transcription with the patient, who gave verbal consent to proceed.  History of Present Illness   Sherri Tess "[name]" is a 43 year old female who presents for a follow-up regarding weight loss management.  She has been increasing her physical activity and monitoring her diet but is concerned that stress may be hindering her weight loss efforts. Her insurance has denied coverage for weight loss medications such as Wegovy and Zepbound. She feels she has reached a plateau in her weight loss journey and is seeking additional support. goes to weight and wellness clinic.    She has a history of  prediabetes and notes that her A1c has improved with weight loss. She is concerned about maintaining her current status and continues to focus on lifestyle modifications.  She is currently taking Vyvanse, 30 mg in the morning and 20 mg in the afternoon, which helps decrease appetite but plateaued. She is also taking Qulipta, which aids in weight loss and migraines. Her wellness doctor is considering reducing the dosage.   depression/anxiety-doing very well on pristiq 100mg  daily.  no SI.   Gets irritability when missing  doses.  Her daily routine includes trying to eat breakfast before taking her medications to avoid nausea, often having a protein shake or smoothie if she cannot eat a solid breakfast. She does not usually take a lunch break at work but tries to snack throughout the day to maintain her metabolism. She has dinner with her family in the evening.  No current issues with sleep or panic attacks. She has only used hydroxyzine twice for these issues. She mentions having a new puppy, which has been a positive addition to her life.       Return in about 6 months (around 04/28/2024) for annual physical.  Angelena Sole, MD

## 2023-12-18 ENCOUNTER — Encounter: Payer: Self-pay | Admitting: Family Medicine

## 2023-12-18 ENCOUNTER — Ambulatory Visit (INDEPENDENT_AMBULATORY_CARE_PROVIDER_SITE_OTHER)

## 2023-12-18 ENCOUNTER — Ambulatory Visit: Admitting: Family Medicine

## 2023-12-18 VITALS — BP 130/79 | HR 65 | Temp 97.7°F | Ht 67.0 in | Wt 255.4 lb

## 2023-12-18 DIAGNOSIS — M79671 Pain in right foot: Secondary | ICD-10-CM | POA: Diagnosis not present

## 2023-12-18 NOTE — Progress Notes (Signed)
 No fracture seen.  Incidentally, some heel spurs but I don't think those are the problems.  We can refer to podiatry or sports med or we can monitor for another week or so-up to her

## 2023-12-18 NOTE — Patient Instructions (Signed)
 It was very nice to see you today!  Elevate Ibuprofen 3x/day   PLEASE NOTE:  If you had any lab tests please let us  know if you have not heard back within a few days. You may see your results on MyChart before we have a chance to review them but we will give you a call once they are reviewed by us . If we ordered any referrals today, please let us  know if you have not heard from their office within the next week.   Please try these tips to maintain a healthy lifestyle:  Eat most of your calories during the day when you are active. Eliminate processed foods including packaged sweets (pies, cakes, cookies), reduce intake of potatoes, white bread, white pasta, and white rice. Look for whole grain options, oat flour or almond flour.  Each meal should contain half fruits/vegetables, one quarter protein, and one quarter carbs (no bigger than a computer mouse).  Cut down on sweet beverages. This includes juice, soda, and sweet tea. Also watch fruit intake, though this is a healthier sweet option, it still contains natural sugar! Limit to 3 servings daily.  Drink at least 1 glass of water with each meal and aim for at least 8 glasses per day  Exercise at least 150 minutes every week.

## 2023-12-18 NOTE — Progress Notes (Signed)
 Subjective:     Patient ID: Sherri Frey, female    DOB: December 19, 1980, 43 y.o.   MRN: [id number]  Chief Complaint  Patient presents with   Foot Pain    HPI Discussed the use of AI scribe software for clinical note transcription with the patient, who gave verbal consent to proceed.  History of Present Illness Sherri Frey is a 43 year old female who presents with right foot pain and swelling.  She has been experiencing right foot pain and swelling since mid-March following a trip to [place], [place]. During the trip, she walked around the zoo for about five and a half hours in well-broken-in [employer] shoes and attended a concert where she was mostly seated. The next morning, she noticed significant swelling and soreness in her right foot, particularly along the lateral side.  Since the initial incident, she has had persistent soreness and tenderness in the right foot, primarily along the lateral border. The pain is described as a dull ache and throbbing sensation, present regardless of her activity level, whether at work, in a car, or at home. The pain intensifies after being on her feet for about twenty minutes.  She has attempted self-management with ibuprofen, Tylenol, and Aleve, as well as heat and ice, but these measures only provide temporary relief. Ibuprofen helps to take the edge off the pain, but it returns upon resuming activity.  No numbness, tingling, additional swelling, redness, or hot tenderness in the right foot. She is currently taking hydrochlorothiazide. She works in a job that requires her to be on her feet all day.    Health Maintenance Due  Topic Date Due   Cervical Cancer Screening (HPV/Pap Cotest)  Never done    Past Medical History:  Diagnosis Date   ADHD    Allergy    Anxiety    Arthritis    Depression    Migraine headache without aura    Sleep apnea    has cpap    Past Surgical History:  Procedure Laterality Date   MOUTH SURGERY     at age 40      Current Outpatient Medications:    cetirizine (ZYRTEC ALLERGY) 10 MG tablet, 1 tablet Orally Once a day, Disp: , Rfl:    desvenlafaxine (PRISTIQ) 100 MG 24 hr tablet, Take 1 tablet (100 mg total) by mouth daily., Disp: 90 tablet, Rfl: 1   Ferrous Sulfate (IRON) 325 (65 Fe) MG TABS, 1 tablet Orally at least Three times a Week. (Tries to take daily.), Disp: , Rfl:    hydrochlorothiazide (HYDRODIURIL) 25 MG tablet, TAKE 1 TABLET BY MOUTH EVERY DAY for 30, Disp: , Rfl:    hydrOXYzine (VISTARIL) 25 MG capsule, Take 1 capsule (25 mg total) by mouth every 8 (eight) hours as needed., Disp: 30 capsule, Rfl: 1   ibuprofen (ADVIL,MOTRIN) 200 MG tablet, Take 400 mg by mouth every 6 (six) hours as needed for moderate pain., Disp: , Rfl:    lisdexamfetamine (VYVANSE) 30 MG capsule, Take 1 capsule by mouth once daily in the morning. (Also takes 20mg  in afternoon). for 30 days, Disp: , Rfl:    Multiple Vitamins-Minerals (CENTRUM WOMEN) TABS, Take 1 tablet by mouth daily., Disp: , Rfl:    ondansetron (ZOFRAN) 4 MG tablet, Take 4 mg by mouth every 8 (eight) hours as needed., Disp: , Rfl:    pantoprazole (PROTONIX) 20 MG tablet, Take 20 mg by mouth every 3 (three) days., Disp: , Rfl:    QULIPTA 60 MG TABS, Take 1  tablet by mouth daily., Disp: , Rfl:    saccharomyces boulardii (FLORASTOR) 250 MG capsule, Take 1 capsule by mouth daily., Disp: , Rfl:    traZODone (DESYREL) 50 MG tablet, Take 50 mg by mouth at bedtime as needed for sleep., Disp: , Rfl:    triamcinolone cream (KENALOG) 0.1 %, Apply 1 Application topically 2 (two) times daily., Disp: 30 g, Rfl: 0   VYVANSE 20 MG capsule, Take 20 mg by mouth every morning., Disp: , Rfl:   Allergies  Allergen Reactions   Coconut Oil Itching and Anaphylaxis    vomiting   Semaglutide     Other Reaction(s): pancreatitis   ROS neg/noncontributory except as noted HPI/below      Objective:     BP 130/79   Pulse 65   Temp 97.7 F (36.5 C)   Ht 5\' 7"  (1.702  m)   Wt 255 lb 6.4 oz (115.8 kg)   SpO2 100%   BMI 40.00 kg/m  Wt Readings from Last 3 Encounters:  12/18/23 255 lb 6.4 oz (115.8 kg)  10/30/23 253 lb 2 oz (114.8 kg)  09/09/23 240 lb (108.9 kg)    Physical Exam   Gen: WDWN NAD HEENT: NCAT, conjunctiva not injected, sclera nonicteric EXT:  no edema MSK: no gross abnormalities.  NEURO: A&O x3.  CN II-XII intact.  PSYCH: normal mood. Good eye contact  Foot R-+TTP 5th metatarsal-worse on plantar surface.  No TTP ankle or fibular head.  Some pain w/dorsiflexion.  No swelling.       Assessment & Plan:  Right foot pain -     DG Foot Complete Right; Future  Assessment and Plan Assessment & Plan Right Foot Pain   Persistent right foot pain and swelling since mid-March, primarily along the lateral side, particularly the fifth metatarsal. The pain is a dull ache with occasional throbbing, without acute injury, redness, or warmth. Differential diagnosis includes stress fracture, Morton's neuroma, or tendonitis. Limited relief with ibuprofen, heat, and ice. Examination reveals tenderness at the fifth metatarsal, suspecting stress fracture. Stress fractures may not be visible on initial x-rays; MRI may be needed if symptoms persist. Order right foot x-ray to evaluate for stress fracture. Advise wearing supportive shoes and taking ibuprofen three times daily to reduce inflammation. Elevate the foot to reduce swelling. Consider referral to a podiatrist if x-ray is inconclusive or symptoms persist. X-ray results will guide further management. If inconclusive, further imaging or specialist referral may be necessary. Proceed to x-ray department for imaging and follow up regarding x-ray results and further management.    Return if symptoms worsen or fail to improve.  Ellsworth Haas, MD

## 2023-12-19 ENCOUNTER — Other Ambulatory Visit: Payer: Self-pay

## 2023-12-19 DIAGNOSIS — M79671 Pain in right foot: Secondary | ICD-10-CM

## 2023-12-19 NOTE — Telephone Encounter (Signed)
 Referral to Sports medicine has been placed

## 2024-03-18 ENCOUNTER — Ambulatory Visit: Admitting: Family Medicine

## 2024-03-18 ENCOUNTER — Ambulatory Visit (INDEPENDENT_AMBULATORY_CARE_PROVIDER_SITE_OTHER)

## 2024-03-18 ENCOUNTER — Encounter: Payer: Self-pay | Admitting: Family Medicine

## 2024-03-18 ENCOUNTER — Ambulatory Visit: Payer: PRIVATE HEALTH INSURANCE | Admitting: Podiatry

## 2024-03-18 VITALS — BP 112/80 | HR 87 | Ht 67.0 in | Wt 253.0 lb

## 2024-03-18 DIAGNOSIS — M79671 Pain in right foot: Secondary | ICD-10-CM

## 2024-03-18 LAB — COMPREHENSIVE METABOLIC PANEL WITH GFR
ALT: 11 U/L (ref 0–35)
AST: 12 U/L (ref 0–37)
Albumin: 4.3 g/dL (ref 3.5–5.2)
Alkaline Phosphatase: 55 U/L (ref 39–117)
BUN: 9 mg/dL (ref 6–23)
CO2: 27 meq/L (ref 19–32)
Calcium: 9.1 mg/dL (ref 8.4–10.5)
Chloride: 102 meq/L (ref 96–112)
Creatinine, Ser: 0.75 mg/dL (ref 0.40–1.20)
GFR: 99.32 mL/min (ref >60.00)
Glucose, Bld: 78 mg/dL (ref 70–99)
Potassium: 3.8 meq/L (ref 3.5–5.1)
Sodium: 135 meq/L (ref 135–145)
Total Bilirubin: 0.5 mg/dL (ref 0.2–1.2)
Total Protein: 7.1 g/dL (ref 6.0–8.3)

## 2024-03-18 LAB — URIC ACID: Uric Acid, Serum: 5.3 mg/dL (ref 2.4–7.0)

## 2024-03-18 MED ORDER — GABAPENTIN 100 MG PO CAPS: 100.0000 mg | ORAL_CAPSULE | Freq: Every evening | ORAL | 2 refills | Status: DC | PRN

## 2024-03-18 NOTE — Progress Notes (Signed)
   I, Leotis Batter, CMA acting as a scribe for Artist Lloyd, MD.  Sherri Frey is a 43 y.o. female who presents to [facility] at [facility] today for R foot pain x 4 months after traveling to [place] for a show and to [place]. Notes that her foot was stepped on at the show, but this was the left foot When she woke the next morning, the right foot was painful and swollen. Pt locates pain to top of the foot towards great toe. Sx worse first thing in the mornings. Worsening swelling throughout the day. Notes worsening foot sx over the past couple of weeks, becoming more constant. Continues to have intermittent swelling, especially after being more active.   Aggravates: WB, ambulation  Treatments tried: IBU, ice, rest  Pertinent review of systems: No fevers or chills  Relevant historical information: Migraine headache and ADHD.   Exam:  BP 112/80   Pulse 87   Ht 5' 7 (1.702 m)   Wt 253 lb (114.8 kg)   SpO2 98%   BMI 39.63 kg/m  General: Well Developed, well nourished, and in no acute distress.   MSK: Right foot normal-appearing Mildly tender palpation lateral forefoot near the fifth metatarsal.  Normal foot and ankle motion.  Some pain with resisted foot eversion is present. Pulses cap refill and sensation are intact distally.    Lab and Radiology Results  X-ray images right foot obtained today personally and independently interpreted. No acute fractures.  No healing stress fracture is visible. Await formal radiology read     Assessment and Plan: 43 y.o. female with right lateral forefoot pain.  Etiology is a bit unclear.  This is a long-term ongoing issue.  Plan for home exercise program.  Will use postop shoe or cam walker boot if needed.  Additionally will check uric acid and metabolic panel for gout. Gabapentin  prescribed for use primarily at bedtime as needed.  If not improved consider MRI.  PDMP not reviewed this encounter. Orders Placed This  Encounter  Procedures   DG Foot Complete Right    Standing Status:   Future    Number of Occurrences:   1    Expiration Date:   03/18/2025    Reason for Exam (SYMPTOM  OR DIAGNOSIS REQUIRED):   right foot pain    Preferred imaging location?:   [place]    Is patient pregnant?:   No   Uric acid    Standing Status:   Future    Number of Occurrences:   1    Expiration Date:   03/18/2025   Comprehensive metabolic panel with GFR    Osteoporis    Standing Status:   Future    Number of Occurrences:   1    Expiration Date:   03/18/2025   Meds ordered this encounter  Medications   gabapentin  (NEURONTIN ) 100 MG capsule    Sig: Take 1-3 capsules (100-300 mg total) by mouth at bedtime as needed.    Dispense:  90 capsule    Refill:  2     Discussed warning signs or symptoms. Please see discharge instructions. Patient expresses understanding.   The above documentation has been reviewed and is accurate and complete Artist Lloyd, M.D.

## 2024-03-18 NOTE — Patient Instructions (Addendum)
 Thank you for coming in today.   Please stop by lab and get XR before you leave.   Start Gabapentin  100-300 mg at bedtime as needed  Home Exercise Program provided - View at [email or web address] code [id number]  As a back up plan, recommend trying a post-op shoe from [facility]  See you back in 2 months.

## 2024-03-19 ENCOUNTER — Ambulatory Visit: Payer: Self-pay | Admitting: Family Medicine

## 2024-03-19 NOTE — Progress Notes (Signed)
 Uric acid level is completely normal at 5.3.  Metabolic panel is normal as well.  Gout is unlikely.

## 2024-03-23 NOTE — Progress Notes (Signed)
Right foot x-ray looks normal to radiology

## 2024-05-06 ENCOUNTER — Ambulatory Visit: Payer: No Typology Code available for payment source | Admitting: Family Medicine

## 2024-05-06 ENCOUNTER — Ambulatory Visit: Payer: Self-pay | Admitting: Family Medicine

## 2024-05-06 ENCOUNTER — Encounter: Payer: Self-pay | Admitting: Family Medicine

## 2024-05-06 VITALS — BP 107/72 | HR 67 | Temp 97.9°F | Resp 18 | Ht 67.0 in | Wt 251.0 lb

## 2024-05-06 DIAGNOSIS — Z0000 Encounter for general adult medical examination without abnormal findings: Secondary | ICD-10-CM | POA: Diagnosis not present

## 2024-05-06 DIAGNOSIS — F32A Depression, unspecified: Secondary | ICD-10-CM | POA: Diagnosis not present

## 2024-05-06 DIAGNOSIS — Z23 Encounter for immunization: Secondary | ICD-10-CM | POA: Diagnosis not present

## 2024-05-06 DIAGNOSIS — F419 Anxiety disorder, unspecified: Secondary | ICD-10-CM

## 2024-05-06 DIAGNOSIS — F902 Attention-deficit hyperactivity disorder, combined type: Secondary | ICD-10-CM | POA: Diagnosis not present

## 2024-05-06 LAB — LIPID PANEL
Cholesterol: 199 mg/dL (ref 0–200)
HDL: 35.5 mg/dL — ABNORMAL LOW (ref >39.00)
LDL Cholesterol: 135 mg/dL — ABNORMAL HIGH (ref 0–99)
NonHDL: 163.79
Total CHOL/HDL Ratio: 6
Triglycerides: 142 mg/dL (ref 0.0–149.0)
VLDL: 28.4 mg/dL (ref 0.0–40.0)

## 2024-05-06 LAB — COMPREHENSIVE METABOLIC PANEL WITH GFR
ALT: 10 U/L (ref 0–35)
AST: 15 U/L (ref 0–37)
Albumin: 4.2 g/dL (ref 3.5–5.2)
Alkaline Phosphatase: 54 U/L (ref 39–117)
BUN: 10 mg/dL (ref 6–23)
CO2: 27 meq/L (ref 19–32)
Calcium: 8.9 mg/dL (ref 8.4–10.5)
Chloride: 103 meq/L (ref 96–112)
Creatinine, Ser: 0.75 mg/dL (ref 0.40–1.20)
GFR: 99.22 mL/min (ref >60.00)
Glucose, Bld: 89 mg/dL (ref 70–99)
Potassium: 4.6 meq/L (ref 3.5–5.1)
Sodium: 136 meq/L (ref 135–145)
Total Bilirubin: 0.6 mg/dL (ref 0.2–1.2)
Total Protein: 7.2 g/dL (ref 6.0–8.3)

## 2024-05-06 LAB — CBC WITH DIFFERENTIAL/PLATELET
Basophils Absolute: 0 K/uL (ref 0.0–0.1)
Basophils Relative: 0.1 % (ref 0.0–3.0)
Eosinophils Absolute: 0 K/uL (ref 0.0–0.7)
Eosinophils Relative: 0 % (ref 0.0–5.0)
HCT: 39.4 % (ref 36.0–46.0)
Hemoglobin: 12.8 g/dL (ref 12.0–15.0)
Lymphocytes Relative: 25 % (ref 12.0–46.0)
Lymphs Abs: 1.6 K/uL (ref 0.7–4.0)
MCHC: 32.5 g/dL (ref 30.0–36.0)
MCV: 82.2 fl (ref 78.0–100.0)
Monocytes Absolute: 0.4 K/uL (ref 0.1–1.0)
Monocytes Relative: 5.9 % (ref 3.0–12.0)
Neutro Abs: 4.5 K/uL (ref 1.4–7.7)
Neutrophils Relative %: 69 % (ref 43.0–77.0)
Platelets: 297 K/uL (ref 150.0–400.0)
RBC: 4.79 Mil/uL (ref 3.87–5.11)
RDW: 15.5 % (ref 11.5–15.5)
WBC: 6.6 K/uL (ref 4.0–10.5)

## 2024-05-06 LAB — HEMOGLOBIN A1C: Hgb A1c MFr Bld: 5.6 % (ref 4.6–6.5)

## 2024-05-06 LAB — TSH: TSH: 1.17 u[IU]/mL (ref 0.35–5.50)

## 2024-05-06 MED ORDER — LISDEXAMFETAMINE DIMESYLATE 50 MG PO CAPS: 50.0000 mg | ORAL_CAPSULE | Freq: Every morning | ORAL | 0 refills | Status: DC

## 2024-05-06 MED ORDER — DESVENLAFAXINE SUCCINATE ER 100 MG PO TB24: 100.0000 mg | ORAL_TABLET | Freq: Every day | ORAL | 1 refills | Status: AC

## 2024-05-06 NOTE — Patient Instructions (Signed)

## 2024-05-06 NOTE — Progress Notes (Signed)
 Phone [phone]   Subjective:   Patient is a 43 y.o. female presenting for annual physical.    Chief Complaint  Patient presents with   Annual Exam    CPE Fasting    Annual-sees gyn.  Some exercise Discussed the use of AI scribe software for clinical note transcription with the patient, who gave verbal consent to proceed.  History of Present Illness Sherri Frey is a 43 year old female who presents for an annual physical exam.  Anxiety/depression-She is currently taking Pristiq , trazodone, and hydroxyzine  as needed. No suicidal ideation is present. Working well  She experiences occasional heartburn and indigestion but does not require daily medication. No chest pain, heart racing, or palpitations.  Migraine-She reports no major headaches recently.  She has right foot pain, previously thought to be a stress fracture. X-rays and blood work were normal, and she is awaiting further evaluation.  No respiratory symptoms such as coughing, wheezing, or shortness of breath, except during episodes of intense emotional stress.  She reports regular menstrual periods and denies any urinary issues.  Her appetite tends towards overeating, particularly making poor food choices during short breaks at work. She has been more physically active recently, engaging in activities like walking with her son who is interested in Pokemon Go.  She smokes.    See problem oriented charting- ROS- ROS: Gen: no fever, chills  Skin: no rash, itching ENT: no ear pain, ear drainage, nasal congestion, rhinorrhea, sinus pressure, sore throat.  Some allergies Eyes: no blurry vision, double vision Resp: no cough, wheeze,SOB CV: no CP, palpitations, LE edema,  GI: no heartburn, n/v/d/c, abd pain GU: no dysuria, urgency, frequency, hematuria MSK: R foot-seeing Dr. Joane Neuro: no dizziness, headache, weakness, vertigo Psych: no  SI   The following were reviewed and entered/updated in epic: Past  Medical History:  Diagnosis Date   ADHD    Allergy    Anxiety    Arthritis    Depression    Migraine headache without aura    Sleep apnea    has cpap   Patient Active Problem List   Diagnosis Date Noted   Anxiety and depression 01/30/2023   Attention deficit hyperactivity disorder (ADHD), combined type 01/30/2023   Migraine without aura and without status migrainosus, not intractable 01/30/2023   Past Surgical History:  Procedure Laterality Date   MOUTH SURGERY     at age 83    Family History  Problem Relation Age of Onset   Hyperlipidemia Mother    Depression Mother    Arthritis Mother    Alcohol abuse Mother    Stroke Father 70 - 67   Alcohol abuse Father    Arthritis Brother     Medications- reviewed and updated Current Outpatient Medications  Medication Sig Dispense Refill   cetirizine (ZYRTEC ALLERGY) 10 MG tablet 1 tablet Orally Once a day     desvenlafaxine  (PRISTIQ ) 100 MG 24 hr tablet Take 1 tablet (100 mg total) by mouth daily. 90 tablet 1   Ferrous Sulfate (IRON) 325 (65 Fe) MG TABS 1 tablet Orally at least Three times a Week. (Tries to take daily.)     fluticasone  (FLONASE  ALLERGY RELIEF) 50 MCG/ACT nasal spray as needed.     gabapentin  (NEURONTIN ) 100 MG capsule Take 1-3 capsules (100-300 mg total) by mouth at bedtime as needed. 90 capsule 2   hydrochlorothiazide (HYDRODIURIL) 25 MG tablet TAKE 1 TABLET BY MOUTH EVERY DAY for 30     hydrOXYzine  (VISTARIL ) 25  MG capsule Take 1 capsule (25 mg total) by mouth every 8 (eight) hours as needed. 30 capsule 1   ibuprofen (ADVIL,MOTRIN) 200 MG tablet Take 400 mg by mouth every 6 (six) hours as needed for moderate pain.     lisdexamfetamine (VYVANSE ) 50 MG capsule Take 50 mg by mouth every morning.     Multiple Vitamins-Minerals (CENTRUM WOMEN) TABS Take 1 tablet by mouth daily.     ondansetron (ZOFRAN) 4 MG tablet Take 4 mg by mouth every 8 (eight) hours as needed.     pantoprazole (PROTONIX) 20 MG tablet Take 20  mg by mouth every 3 (three) days.     QULIPTA 60 MG TABS Take 1 tablet by mouth daily.     saccharomyces boulardii (FLORASTOR) 250 MG capsule Take 1 capsule by mouth daily.     Semaglutide,0.25 or 0.5MG /DOS, (OZEMPIC, 0.25 OR 0.5 MG/DOSE,) 2 MG/3ML SOPN inject 0.25mg  Subcutaneous once a week     traZODone (DESYREL) 50 MG tablet Take 50 mg by mouth at bedtime as needed for sleep.     triamcinolone  cream (KENALOG ) 0.1 % Apply 1 Application topically 2 (two) times daily. 30 g 0   VYVANSE  20 MG capsule Take 20 mg by mouth every morning.     No current facility-administered medications for this visit.    Allergies-reviewed and updated Allergies  Allergen Reactions   Coconut Oil Itching and Anaphylaxis    vomiting   Semaglutide     Other Reaction(s): pancreatitis  Other Reaction(s): pancreatitis/side cramps    Social History   Social History Narrative   Not on file   Objective  Objective:  BP 107/72   Pulse 67   Temp 97.9 F (36.6 C) (Temporal)   Resp 18   Ht 5' 7 (1.702 m)   Wt 251 lb (113.9 kg)   LMP 04/17/2024 (Approximate)   SpO2 99%   BMI 39.31 kg/m  Physical Exam  Gen: WDWN NAD HEENT: NCAT, conjunctiva not injected, sclera nonicteric TM WNL B, OP moist, no exudates  NECK:  supple, no thyromegaly, no nodes, no carotid bruits CARDIAC: RRR, S1S2+, no murmur. DP 2+B LUNGS: CTAB. No wheezes ABDOMEN:  BS+, soft, NTND, No HSM, no masses EXT:  no edema MSK: no gross abnormalities. MS 5/5 all 4 NEURO: A&O x3.  CN II-XII intact.  PSYCH: normal mood. Good eye contact     Assessment and Plan   Health Maintenance counseling: 1. Anticipatory guidance: Patient counseled regarding regular dental exams q6 months, eye exams,  avoiding smoking and second hand smoke, limiting alcohol to 1 beverage per day, no illicit drugs.   2. Risk factor reduction:  Advised patient of need for regular exercise and diet rich and fruits and vegetables to reduce risk of heart attack and stroke.  Exercise- some.  Wt Readings from Last 3 Encounters:  05/06/24 251 lb (113.9 kg)  03/18/24 253 lb (114.8 kg)  12/18/23 255 lb 6.4 oz (115.8 kg)   3. Immunizations/screenings/ancillary studies Immunization History  Administered Date(s) Administered   Fluzone Influenza virus vaccine,trivalent (IIV3), split virus 06/19/2017   Influenza,inj,Quad PF,6+ Mos 05/01/2018, 06/06/2022   PFIZER(Purple Top)SARS-COV-2 Vaccination 01/23/2020, 02/13/2020   Tdap 12/22/2015, 02/18/2019   Health Maintenance Due  Topic Date Due   Pneumococcal Vaccine (1 of 2 - PCV) Never done   Hepatitis B Vaccines 19-59 Average Risk (1 of 3 - 19+ 3-dose series) Never done   HPV VACCINES (1 - 3-dose SCDM series) Never done   Cervical Cancer Screening (  HPV/Pap Cotest)  Never done   INFLUENZA VACCINE  04/03/2024   COVID-19 Vaccine (3 - 2025-26 season) 05/04/2024    4. Cervical cancer screening- gyn Fogelman 5. Breast cancer screening-  mammogram gyn 6. Colon cancer screening - n/a 7. Skin cancer screening- advised regular sunscreen use. Denies worrisome, changing, or new skin lesions.  8. Birth control/STD check- n/a 9. Osteoporosis screening- n/a 10. Smoking associated screening - + smoker  Wellness examination -     CBC with Differential/Platelet -     Comprehensive metabolic panel with GFR -     Lipid panel -     TSH -     Hemoglobin A1c  Need for pneumococcal vaccination -     Pneumococcal conjugate vaccine 20-valent   Wellness-anticipatory guidance.  Work on Diet/Exercise  Check CBC,CMP,lipids,TSH, A1C.  F/u 1 yr . Pneumo 20 given for smoker.  Advised to quit Assessment and Plan Assessment & Plan Adult Wellness Visit   Routine adult wellness visit. She is due for a Pap smear and mammogram. Exercise has increased due to activities with her son. Encourage scheduling a Pap smear and mammogram with a gynecologist. Advise on maintaining a healthy diet and regular exercise. Discuss the importance of wearing  seatbelts, sunscreen, and avoiding smoking and alcohol. Recommend regular dental visits.  Major depressive disorder, single episode, unspecified   Major depressive disorder is well-controlled with Pristiq , trazodone, and hydroxyzine  as needed. No suicidal ideation reported. Continue current medications: Pristiq , trazodone, and hydroxyzine  as needed.  Migraine, unspecified, not intractable, without status migrainosus   Migraine headaches are well-controlled. No recent severe episodes reported.  Right foot pain, possible stress fracture   Right foot pain with a possible stress fracture. Previous x-rays and blood work were unremarkable. MRI may be considered if symptoms persist. She is concerned about the cost of an MRI due to insurance issues. Follow up with Dr. Joane for re-evaluation of foot pain.  Tobacco use   Current tobacco use increases risk for pneumonia. Discussed potential benefits of pneumococcal vaccination. 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 Offer pneumococcal vaccination.  ADHD-chronic.  Stable on vyvanse  50mg  daily.  Pdmp checked    Recommended follow up: Return in about 6 months (around 11/03/2024) for mood.  Lab/Order associations:+ fasting  Jenkins CHRISTELLA Carrel, MD

## 2024-05-06 NOTE — Progress Notes (Signed)
 Labs better.  Keep working on diet/exercise

## 2024-05-19 NOTE — Progress Notes (Unsigned)
   LILLETTE Ileana Collet, PhD, LAT, ATC acting as a scribe for Artist Lloyd, MD.  Sherri Frey is a 43 y.o. female who presents to [facility] at [facility] today for 8-wk f/u R foot pain. Pt was last seen by Dr. Lloyd on 03/18/24 and was advised post-op shoe or CAM walker boot prn. She was taught HEP, prescribed gabapentin , and labs were obtained.  Today, pt reports ***  Dx testing: 03/18/24 R foot XR & labs  12/18/23 R foot XR  Pertinent review of systems: ***  Relevant historical information: ***   Exam:  LMP 04/17/2024 (Approximate)  General: Well Developed, well nourished, and in no acute distress.   MSK: ***    Lab and Radiology Results No results found for this or any previous visit (from the past 72 hours). No results found.     Assessment and Plan: 43 y.o. female with ***   PDMP not reviewed this encounter. No orders of the defined types were placed in this encounter.  No orders of the defined types were placed in this encounter.    Discussed warning signs or symptoms. Please see discharge instructions. Patient expresses understanding.   ***

## 2024-05-20 ENCOUNTER — Other Ambulatory Visit: Payer: Self-pay | Admitting: Family Medicine

## 2024-05-20 ENCOUNTER — Other Ambulatory Visit: Payer: Self-pay

## 2024-05-20 ENCOUNTER — Ambulatory Visit (INDEPENDENT_AMBULATORY_CARE_PROVIDER_SITE_OTHER): Admitting: Family Medicine

## 2024-05-20 ENCOUNTER — Encounter: Payer: Self-pay | Admitting: Family Medicine

## 2024-05-20 ENCOUNTER — Ambulatory Visit (INDEPENDENT_AMBULATORY_CARE_PROVIDER_SITE_OTHER)

## 2024-05-20 VITALS — BP 112/74 | HR 69 | Ht 67.0 in | Wt 256.0 lb

## 2024-05-20 DIAGNOSIS — M79671 Pain in right foot: Secondary | ICD-10-CM

## 2024-05-20 DIAGNOSIS — G8929 Other chronic pain: Secondary | ICD-10-CM

## 2024-05-20 DIAGNOSIS — M25571 Pain in right ankle and joints of right foot: Secondary | ICD-10-CM

## 2024-05-20 NOTE — Patient Instructions (Addendum)
 Thank you for coming in today.   You received an injection today. Seek immediate medical attention if the joint becomes red, extremely painful, or is oozing fluid.   Please get an Xray today before you leave   For discounted MRI options, visit: [email or web address]  See you back as needed.

## 2024-05-25 ENCOUNTER — Ambulatory Visit: Payer: Self-pay | Admitting: Family Medicine

## 2024-05-25 NOTE — Progress Notes (Signed)
Right ankle x-ray looks normal to radiology

## 2024-05-25 NOTE — Telephone Encounter (Signed)
 Forwarding to Dr. Denyse Amass to review and advise.

## 2024-06-24 ENCOUNTER — Encounter: Payer: Self-pay | Admitting: Family Medicine

## 2024-06-24 ENCOUNTER — Ambulatory Visit: Admitting: Family Medicine

## 2024-06-24 VITALS — BP 118/78 | HR 82 | Temp 98.2°F | Resp 18 | Ht 67.0 in | Wt 253.0 lb

## 2024-06-24 DIAGNOSIS — Z23 Encounter for immunization: Secondary | ICD-10-CM | POA: Diagnosis not present

## 2024-06-24 DIAGNOSIS — E669 Obesity, unspecified: Secondary | ICD-10-CM

## 2024-06-24 DIAGNOSIS — Z6839 Body mass index (BMI) 39.0-39.9, adult: Secondary | ICD-10-CM | POA: Diagnosis not present

## 2024-06-24 DIAGNOSIS — G43009 Migraine without aura, not intractable, without status migrainosus: Secondary | ICD-10-CM | POA: Diagnosis not present

## 2024-06-24 MED ORDER — QULIPTA 60 MG PO TABS
1.0000 | ORAL_TABLET | Freq: Every day | ORAL | 3 refills | Status: AC
Start: 2024-06-24 — End: ?

## 2024-06-24 NOTE — Patient Instructions (Signed)

## 2024-06-24 NOTE — Progress Notes (Signed)
 Subjective:     Patient ID: Sherri Frey, female    DOB: 04-10-1981, 43 y.o.   MRN: [id number]  Chief Complaint  Patient presents with   Medication Refill    Discussed the use of AI scribe software for clinical note transcription with the patient, who gave verbal consent to proceed.  History of Present Illness Sherri Frey is a 43 year old female who presents for medication management and migraine control.  She is currently taking Qulipta, which was initially prescribed to help with appetite and weight loss, but she has found it beneficial for managing migraines. Since starting Qulipta, she has rarely needed to take her prescribed headache medication, rizatriptan, and has only taken it once. Previously, she would experience about three severe migraines a year, with one being particularly debilitating. She also experiences smaller headaches that are manageable with Advil, but these have also decreased in frequency since starting Mabie.  She is currently taking Vyvanse , with a 50 mg dose daily and a 20 mg dose as needed. Her last refill was on October 5th, and she has only taken one or two of the 20 mg doses since then.   She has not been using her semaglutide (Ozempic) recently, although she has it in her possession. She attributes this to a busy October.  She keeps her medications in the top drawer of her dresser to prevent access by her children. She mentions a busy lifestyle and a preference for physical activity. No thoughts of suicide.    Health Maintenance Due  Topic Date Due   Hepatitis B Vaccines 19-59 Average Risk (1 of 3 - 19+ 3-dose series) Never done   HPV VACCINES (1 - 3-dose SCDM series) Never done   Cervical Cancer Screening (HPV/Pap Cotest)  Never done   Mammogram  Never done   COVID-19 Vaccine (3 - 2025-26 season) 05/04/2024    Past Medical History:  Diagnosis Date   ADHD    Allergy    Anxiety    Arthritis    Depression    Migraine headache without  aura    Sleep apnea    has cpap    Past Surgical History:  Procedure Laterality Date   MOUTH SURGERY     at age 63     Current Outpatient Medications:    cetirizine (ZYRTEC ALLERGY) 10 MG tablet, 1 tablet Orally Once a day, Disp: , Rfl:    desvenlafaxine  (PRISTIQ ) 100 MG 24 hr tablet, Take 1 tablet (100 mg total) by mouth daily., Disp: 90 tablet, Rfl: 1   Ferrous Sulfate (IRON) 325 (65 Fe) MG TABS, 1 tablet Orally at least Three times a Week. (Tries to take daily.), Disp: , Rfl:    fluticasone  (FLONASE  ALLERGY RELIEF) 50 MCG/ACT nasal spray, as needed., Disp: , Rfl:    gabapentin  (NEURONTIN ) 100 MG capsule, Take 1-3 capsules (100-300 mg total) by mouth at bedtime as needed., Disp: 90 capsule, Rfl: 2   hydrochlorothiazide (HYDRODIURIL) 25 MG tablet, TAKE 1 TABLET BY MOUTH EVERY DAY for 30, Disp: , Rfl:    hydrOXYzine  (VISTARIL ) 25 MG capsule, Take 1 capsule (25 mg total) by mouth every 8 (eight) hours as needed., Disp: 30 capsule, Rfl: 1   ibuprofen (ADVIL,MOTRIN) 200 MG tablet, Take 400 mg by mouth every 6 (six) hours as needed for moderate pain., Disp: , Rfl:    lisdexamfetamine (VYVANSE ) 50 MG capsule, Take 1 capsule (50 mg total) by mouth every morning., Disp: 30 capsule, Rfl: 0   Multiple  Vitamins-Minerals (CENTRUM WOMEN) TABS, Take 1 tablet by mouth daily., Disp: , Rfl:    ondansetron (ZOFRAN) 4 MG tablet, Take 4 mg by mouth every 8 (eight) hours as needed., Disp: , Rfl:    pantoprazole (PROTONIX) 20 MG tablet, Take 20 mg by mouth every 3 (three) days., Disp: , Rfl:    QULIPTA 60 MG TABS, Take 1 tablet by mouth daily., Disp: , Rfl:    saccharomyces boulardii (FLORASTOR) 250 MG capsule, Take 1 capsule by mouth daily., Disp: , Rfl:    Semaglutide,0.25 or 0.5MG /DOS, (OZEMPIC, 0.25 OR 0.5 MG/DOSE,) 2 MG/3ML SOPN, inject 0.25mg  Subcutaneous once a week, Disp: , Rfl:    traZODone (DESYREL) 50 MG tablet, Take 50 mg by mouth at bedtime as needed for sleep., Disp: , Rfl:    triamcinolone   cream (KENALOG ) 0.1 %, Apply 1 Application topically 2 (two) times daily., Disp: 30 g, Rfl: 0   VYVANSE  20 MG capsule, Take 20 mg by mouth every morning., Disp: , Rfl:   Allergies  Allergen Reactions   Coconut Oil Itching and Anaphylaxis    vomiting   Semaglutide     Other Reaction(s): pancreatitis  Other Reaction(s): pancreatitis/side cramps   ROS neg/noncontributory except as noted HPI/below      Objective:     BP 118/78   Pulse 82   Temp 98.2 F (36.8 C)   Resp 18   Ht 5' 7 (1.702 m)   Wt 253 lb (114.8 kg)   LMP 06/05/2024   SpO2 100%   BMI 39.63 kg/m  Wt Readings from Last 3 Encounters:  06/24/24 253 lb (114.8 kg)  05/20/24 256 lb (116.1 kg)  05/06/24 251 lb (113.9 kg)    Physical Exam GENERAL: Well developed well nourished no acute distress HEAD EYES EARS NOSE THROAT: Normocephalic atraumatic, conjunctiva not injected, sclera nonicteric EXTREMITIES: No edema MUSCULOSKELETAL: No gross abnormalities NEUROLOGICAL: Alert and oriented x3, cranial nerves II through XII intact PSYCHIATRIC: Normal mood, good eye contact       Assessment & Plan:  There are no diagnoses linked to this encounter.  Assessment and Plan Assessment & Plan Migraine   Chronic migraines have decreased in frequency and severity since starting Qulipta, with only one severe migraine requiring rizatriptan. Continue Qulipta for prevention and ensure the prescription is sent to [facility] for a 90-day supply.  Obesity   Obesity management with Qulipta has plateaued. Semaglutide (Ozempic) is available but not recently used due to a busy schedule. Resume semaglutide as per previous instructions to aid in weight management.     Return for as sch 11/04/24.  Jenkins CHRISTELLA Carrel, MD

## 2024-07-13 ENCOUNTER — Other Ambulatory Visit: Payer: Self-pay | Admitting: Family Medicine

## 2024-07-13 DIAGNOSIS — F902 Attention-deficit hyperactivity disorder, combined type: Secondary | ICD-10-CM

## 2024-07-13 MED ORDER — LISDEXAMFETAMINE DIMESYLATE 50 MG PO CAPS: 50.0000 mg | ORAL_CAPSULE | Freq: Every morning | ORAL | 0 refills | Status: DC

## 2024-07-13 NOTE — Telephone Encounter (Signed)
 Copied from CRM [id number]. Topic: Clinical - Medication Refill >> Jul 13, 2024  3:07 PM Sasha M wrote: Medication: lisdexamfetamine (VYVANSE ) 50 MG capsule  Has the patient contacted their pharmacy? Yes (Agent: If no, request that the patient contact the pharmacy for the refill. If patient does not wish to contact the pharmacy document the reason why and proceed with request.) (Agent: If yes, when and what did the pharmacy advise?)  This is the patient's preferred pharmacy:  [id number] [place], [place] - [address] AT [facility] [address] [place] [address] Phone: [phone] Fax: [phone]  Is this the correct pharmacy for this prescription? Yes If no, delete pharmacy and type the correct one.   Has the prescription been filled recently? No  Is the patient out of the medication? Yes  Has the patient been seen for an appointment in the last year OR does the patient have an upcoming appointment? Yes  Can we respond through MyChart? Yes  Agent: Please be advised that Rx refills may take up to 3 business days. We ask that you follow-up with your pharmacy.

## 2024-08-14 ENCOUNTER — Other Ambulatory Visit: Payer: Self-pay | Admitting: Family Medicine

## 2024-08-14 DIAGNOSIS — F902 Attention-deficit hyperactivity disorder, combined type: Secondary | ICD-10-CM

## 2024-08-14 MED ORDER — LISDEXAMFETAMINE DIMESYLATE 50 MG PO CAPS: 50.0000 mg | ORAL_CAPSULE | Freq: Every morning | ORAL | 0 refills | Status: DC

## 2024-08-14 NOTE — Telephone Encounter (Signed)
 Copied from CRM #[id number]. Topic: Clinical - Medication Refill >> Aug 14, 2024 10:08 AM Victoria A wrote: Medication: lisdexamfetamine (VYVANSE ) 50 MG capsule  Has the patient contacted their pharmacy? No (Agent: If no, request that the patient contact the pharmacy for the refill. If patient does not wish to contact the pharmacy document the reason why and proceed with request.) (Agent: If yes, when and what did the pharmacy advise?)  This is the patient's preferred pharmacy:  [id number] - [place], [place] - [address] AT [facility] [address] [place] [address] Phone: [phone] Fax: [phone]  Is this the correct pharmacy for this prescription? Yes If no, delete pharmacy and type the correct one.   Has the prescription been filled recently? No  Is the patient out of the medication? Yes  Has the patient been seen for an appointment in the last year OR does the patient have an upcoming appointment? Yes  Can we respond through MyChart? Yes  Agent: Please be advised that Rx refills may take up to 3 business days. We ask that you follow-up with your pharmacy.

## 2024-09-02 ENCOUNTER — Other Ambulatory Visit: Payer: Self-pay | Admitting: Family Medicine

## 2024-09-02 DIAGNOSIS — F902 Attention-deficit hyperactivity disorder, combined type: Secondary | ICD-10-CM

## 2024-09-02 NOTE — Telephone Encounter (Unsigned)
 Copied from CRM #[id number]. Topic: Clinical - Medication Refill >> Sep 02, 2024 12:15 PM Ashley R wrote: Medication:  lisdexamfetamine  (VYVANSE ) 20 MG capsule - pt states has spoken with PCP about adding this to her current medications/ taking over from old prescriber.   Has the patient contacted their pharmacy? Yes  This is the patient's preferred pharmacy:  [id number] - [place], [place] - [address] AT [facility] [address] [place] [address] Phone: [phone] Fax: [phone]  Is this the correct pharmacy for this prescription? Yes If no, delete pharmacy and type the correct one.   Has the prescription been filled recently? Yes, 10/25  Is the patient out of the medication? Yes  Has the patient been seen for an appointment in the last year OR does the patient have an upcoming appointment? Yes  Can we respond through MyChart? Yes  Agent: Please be advised that Rx refills may take up to 3 business days. We ask that you follow-up with your pharmacy.

## 2024-09-02 NOTE — Telephone Encounter (Signed)
 pt states has spoken with PCP about adding this to her current medications/ taking over from old prescriber.

## 2024-09-03 NOTE — ED Triage Notes (Signed)
 Severe intermittent lower abdominal cramping x 2 weeks. Thought it was menstrual cramps at first, but her period stopped a week ago and the pain has continued.

## 2024-09-04 ENCOUNTER — Encounter: Payer: Self-pay | Admitting: Family Medicine

## 2024-09-04 MED ORDER — LISDEXAMFETAMINE DIMESYLATE 50 MG PO CAPS: 50.0000 mg | ORAL_CAPSULE | Freq: Every morning | ORAL | 0 refills | Status: AC

## 2024-09-04 NOTE — Telephone Encounter (Signed)
 Please call patient for an Acute appointment next. Thank you

## 2024-09-04 NOTE — Telephone Encounter (Signed)
 06/24/2024 LOV  01/16/2023 fill date  30/0 refills

## 2024-09-04 NOTE — Telephone Encounter (Signed)
 Please review and advise.

## 2024-09-05 ENCOUNTER — Emergency Department (HOSPITAL_COMMUNITY)
Admission: EM | Admit: 2024-09-05 | Discharge: 2024-09-05 | Disposition: A | Discharge status: Home / Self Care | Source: Home / Self Care | Attending: Emergency Medicine | Admitting: Emergency Medicine

## 2024-09-05 ENCOUNTER — Emergency Department (HOSPITAL_COMMUNITY)

## 2024-09-05 DIAGNOSIS — R102 Pelvic and perineal pain unspecified side: Secondary | ICD-10-CM | POA: Diagnosis present

## 2024-09-05 DIAGNOSIS — N83201 Unspecified ovarian cyst, right side: Secondary | ICD-10-CM | POA: Insufficient documentation

## 2024-09-05 MED ORDER — OXYCODONE-ACETAMINOPHEN 5-325 MG PO TABS
1.0000 | ORAL_TABLET | Freq: Once | ORAL | Status: AC
  Administered 2024-09-05: 1 via ORAL
  Filled 2024-09-05: qty 1

## 2024-09-05 MED ORDER — ONDANSETRON HCL 4 MG PO TABS: 4.0000 mg | ORAL_TABLET | Freq: Four times a day (QID) | ORAL | 0 refills | Status: DC

## 2024-09-05 MED ORDER — OXYCODONE HCL 5 MG PO TABS: 5.0000 mg | ORAL_TABLET | Freq: Four times a day (QID) | ORAL | 0 refills | Status: DC | PRN

## 2024-09-05 MED ORDER — ONDANSETRON 4 MG PO TBDP
4.0000 mg | ORAL_TABLET | Freq: Once | ORAL | Status: AC
  Administered 2024-09-05: 4 mg via ORAL
  Filled 2024-09-05: qty 1

## 2024-09-05 NOTE — ED Triage Notes (Signed)
 Patient reports they were evaluated days ago for lower abd pain and was cleared, was told to do outpatient ultrasound Patient states they are here for ultrasound only Reports abd pain in triage now is rated 7/10  Pain present for 2.5 weeks  Patient says they do not want any blood work or testing other than ultrasound Declines labs and EKG

## 2024-09-05 NOTE — ED Provider Notes (Signed)
 " [facility] AT [facility] Provider Note   CSN: [id number] Arrival date & time: 09/05/24  1455     Patient presents with: Abdominal Pain   Sherri Frey is a 44 y.o. female.   Patient is a 44 year old female with a past medical history of depression and anxiety presenting to the emergency department with pelvic pain.  The patient states that she has had pelvic pain for the last 2-1/2 weeks.  She states that she was initially on her cycle however since that has stopped the pain is only worsened.  She states that she did have some brownish discharge today but has had no other abnormal discharge.  Denies any concern for STD.  She denies any dysuria or hematuria.  Reports nausea but denies any vomiting, denies any fever.  She was seen at an outside ED 2 days ago and had normal lab work and was referred for an outpatient ultrasound but due to a mixup in scheduling she is not able to get this done for several weeks and is concerned about waiting due to the severity of her pain.  She states that she has never had pain like this before.  States she has been taking Motrin at home with minimal relief.  The history is provided by the patient.  Abdominal Pain      Prior to Admission medications  Medication Sig Start Date End Date Taking? Authorizing Provider  ondansetron  (ZOFRAN ) 4 MG tablet Take 1 tablet (4 mg total) by mouth every 6 (six) hours. 09/05/24  Yes Kingsley, Lyonel Morejon K, DO  oxyCODONE  (ROXICODONE ) 5 MG immediate release tablet Take 1 tablet (5 mg total) by mouth every 6 (six) hours as needed. 09/05/24  Yes Kingsley, Kaylor Maiers K, DO  cetirizine (ZYRTEC ALLERGY) 10 MG tablet 1 tablet Orally Once a day    [provider]  desvenlafaxine  (PRISTIQ ) 100 MG 24 hr tablet Take 1 tablet (100 mg total) by mouth daily. 05/06/24   Wendolyn Jenkins Jansky, MD  Ferrous Sulfate (IRON) 325 (65 Fe) MG TABS 1 tablet Orally at least Three times a Week. (Tries to take daily.)     [provider]  fluticasone  (FLONASE  ALLERGY RELIEF) 50 MCG/ACT nasal spray as needed.    [provider]  gabapentin  (NEURONTIN ) 100 MG capsule Take 1-3 capsules (100-300 mg total) by mouth at bedtime as needed. 03/18/24   Corey, Evan S, MD  hydrochlorothiazide (HYDRODIURIL) 25 MG tablet TAKE 1 TABLET BY MOUTH EVERY DAY for 30    [provider]  hydrOXYzine  (VISTARIL ) 25 MG capsule Take 1 capsule (25 mg total) by mouth every 8 (eight) hours as needed. 01/30/23   Wendolyn Jenkins Jansky, MD  ibuprofen (ADVIL,MOTRIN) 200 MG tablet Take 400 mg by mouth every 6 (six) hours as needed for moderate pain.    [provider]  lisdexamfetamine  (VYVANSE ) 50 MG capsule Take 1 capsule (50 mg total) by mouth every morning. 09/04/24   Wendolyn Jenkins Jansky, MD  Multiple Vitamins-Minerals (CENTRUM WOMEN) TABS Take 1 tablet by mouth daily.    [provider]  pantoprazole (PROTONIX) 20 MG tablet Take 20 mg by mouth every 3 (three) days.    [provider]  QULIPTA  60 MG TABS Take 1 tablet (60 mg total) by mouth daily. 06/24/24   Wendolyn Jenkins Jansky, MD  saccharomyces boulardii (FLORASTOR) 250 MG capsule Take 1 capsule by mouth daily.    [provider]  Semaglutide,0.25 or 0.5MG /DOS, (OZEMPIC, 0.25 OR 0.5 MG/DOSE,)  2 MG/3ML SOPN inject 0.25mg  Subcutaneous once a week 01/30/24   [provider]  traZODone (DESYREL) 50 MG tablet Take 50 mg by mouth at bedtime as needed for sleep.    [provider]  triamcinolone  cream (KENALOG ) 0.1 % Apply 1 Application topically 2 (two) times daily. 09/09/23   Acevedo, Angela, PA  VYVANSE  20 MG capsule Take 20 mg by mouth every morning. 01/16/23   [provider]    Allergies: Coconut oil and Semaglutide    Review of Systems  Gastrointestinal:  Positive for abdominal pain.    Updated Vital Signs BP 130/78   Pulse 70   Temp 97.9 F (36.6 C)   Resp 18   Ht 5' 6 (1.676 m)   Wt 114.3 kg   LMP 08/21/2024    SpO2 99%   BMI 40.67 kg/m   Physical Exam Vitals and nursing note reviewed.  Constitutional:      Appearance: She is well-developed.  HENT:     Head: Normocephalic and atraumatic.     Mouth/Throat:     Mouth: Mucous membranes are moist.  Eyes:     Extraocular Movements: Extraocular movements intact.  Cardiovascular:     Rate and Rhythm: Normal rate and regular rhythm.     Heart sounds: Normal heart sounds.  Pulmonary:     Effort: Pulmonary effort is normal.     Breath sounds: Normal breath sounds.  Abdominal:     General: Abdomen is flat.     Palpations: Abdomen is soft.     Tenderness: There is abdominal tenderness in the suprapubic area. There is no right CVA tenderness, left CVA tenderness, guarding or rebound.  Skin:    General: Skin is warm and dry.  Neurological:     General: No focal deficit present.     Mental Status: She is alert and oriented to person, place, and time.  Psychiatric:        Mood and Affect: Mood normal.        Behavior: Behavior normal.     (all labs ordered are listed, but only abnormal results are displayed) Labs Reviewed - No data to display  EKG: None  Radiology: US  PELVIC COMPLETE W TRANSVAGINAL AND TORSION R/O Result Date: 09/05/2024 EXAM: US  Pelvis, Complete Transvaginal and Transabdominal without Doppler TECHNIQUE: Transabdominal and transvaginal pelvic duplex ultrasound using B-mode/gray scaled imaging with color flow and spectral Doppler analysis was obtained. COMPARISON: CT abdomen and pelvis 01/10/2014 CLINICAL HISTORY: Pelvic pain FINDINGS: UTERUS: The uterus is retroverted, measuring 6.3 x 4.8 x 6.2 cm, with a volume of 98 ml. Uterus demonstrates normal myometrial echotexture. Small nabothian cysts in the cervix. ENDOMETRIAL STRIPE: Endometrium measures 7.5 mm. Endometrial stripe is within normal limits. RIGHT OVARY: Right ovary measures 6.7 x 3.7 x 3.5 cm, with a volume of 45 ml. A complex right ovarian cyst with septation is  present. This measures 4.8 x 3 x 4.1 cm. Color flow Doppler images demonstrate flow. Spectral Doppler images demonstrate normal arterial and venous waveforms. Follow-up recommendations for a 4.8 cm complex cyst in a 44 year old patient. LEFT OVARY: Somewhat limited visualization of the left ovary due to bowel gas. The left ovary measures 2.2 x 2 x 2.3 cm, with a volume of 5 ml. No abnormality in the adnexal mass. Color flow Doppler images demonstrate flow. Spectral Doppler images demonstrate normal arterial and venous waveforms. FREE FLUID: No free fluid in the abdomen. IMPRESSION: 1. Complex right ovarian cyst with septation measuring 4.8 cm, for  which follow-up pelvic ultrasound in 6-12 weeks is recommended. 2. Somewhat limited visualization of the left ovary due to bowel gas. Electronically signed by: Elsie Gravely MD 09/05/2024 06:21 PM EST RP Workstation: [id number]     Procedures   Medications Ordered in the ED  oxyCODONE -acetaminophen  (PERCOCET/ROXICET) 5-325 MG per tablet 1 tablet (1 tablet Oral Given 09/05/24 1747)  ondansetron  (ZOFRAN -ODT) disintegrating tablet 4 mg (4 mg Oral Given 09/05/24 1748)                                    Medical Decision Making This patient presents to the ED with chief complaint(s) of pelvic pain with pertinent past medical history of anxiety, depression which further complicates the presenting complaint. The complaint involves an extensive differential diagnosis and also carries with it a high risk of complications and morbidity.    The differential diagnosis includes patient's workup 2 days ago was negative for pregnancy or UTI, considering TOA or torsion though less likely due to the chronicity of the pain, fibroids, other intra-abdominal infection  Additional history obtained: Additional history obtained from spouse Records reviewed Care Everywhere/External Records  ED Course and Reassessment: On patient's arrival she is hemodynamically stable in no  acute distress.  She will have pelvic ultrasound performed, offered STI testing however declined.  Was given pain control.  Independent labs interpretation:  N/A  Independent visualization of imaging: - I independently visualized the following imaging with scope of interpretation limited to determining acute life threatening conditions related to emergency care: pelvic ultrasound, which revealed 4.8 cm R ovarian cyst  Consultation: - Consulted or discussed management/test interpretation w/ external professional: N/A  Consideration for admission or further workup: Patient has no emergent conditions requiring admission or further work-up at this time and is stable for discharge home with GYN follow-up  Social Determinants of health: N/A    Amount and/or Complexity of Data Reviewed Radiology: ordered.  Risk Prescription drug management.       Final diagnoses:  Cyst of right ovary    ED Discharge Orders          Ordered    ondansetron  (ZOFRAN ) 4 MG tablet  Every 6 hours        09/05/24 1937    oxyCODONE  (ROXICODONE ) 5 MG immediate release tablet  Every 6 hours PRN        09/05/24 1937               Kingsley, Roman Sandall K, DO 09/05/24 1938  "

## 2024-09-05 NOTE — Discharge Instructions (Addendum)
 You were seen in the emergency department for your pelvic pain.  Your ultrasound shows that you have a 4.8 cm complex appearing cyst on your right ovary which is likely causing some pain.  You can take Tylenol  and Motrin every 6 hours and I have given you oxycodone  for breakthrough pain.  This can make you drowsy so do not take before driving, working or operating heavy machinery.  You should follow-up with your GYN to have your symptoms rechecked and are recommended to have repeat imaging in the next 6 to 12 weeks.  You should return to the emergency department for significantly worsening pain, repetitive vomiting, fevers or any other new or concerning symptoms.

## 2024-09-07 NOTE — Telephone Encounter (Signed)
 Pt was seen again at ED on 09/04/24 and had ultrasound. Advised to see an OB for possible surgery. Pt is currently trying to find an OB that is accepting new patients. Advised if needed, pt can schedule an appt with provider for a referral.

## 2024-09-08 ENCOUNTER — Other Ambulatory Visit: Payer: Self-pay

## 2024-09-08 DIAGNOSIS — R102 Pelvic and perineal pain unspecified side: Secondary | ICD-10-CM

## 2024-09-11 ENCOUNTER — Ambulatory Visit: Admitting: Family Medicine

## 2024-09-14 ENCOUNTER — Other Ambulatory Visit: Payer: Self-pay | Admitting: Family Medicine

## 2024-09-14 DIAGNOSIS — F902 Attention-deficit hyperactivity disorder, combined type: Secondary | ICD-10-CM

## 2024-09-14 NOTE — Telephone Encounter (Unsigned)
 Copied from CRM [id number]. Topic: Clinical - Medication Refill >> Sep 14, 2024  4:12 PM Kevelyn M wrote: Medication: lisdexamfetamine  (VYVANSE ) 20 MG capsule spoke to provider about taking this one over, lisdexamfetamine  (VYVANSE ) 50 MG capsule  Has the patient contacted their pharmacy? Yes (Agent: If no, request that the patient contact the pharmacy for the refill. If patient does not wish to contact the pharmacy document the reason why and proceed with request.) (Agent: If yes, when and what did the pharmacy advise?)  This is the patient's preferred pharmacy:  [id number] - [place], [place] - [address] AT [facility] [address] [place] [address] Phone: [phone] Fax: [phone]  Is this the correct pharmacy for this prescription? Yes If no, delete pharmacy and type the correct one.   Has the prescription been filled recently? No  Is the patient out of the medication? No  Has the patient been seen for an appointment in the last year OR does the patient have an upcoming appointment? Yes  Can we respond through MyChart? Yes  Agent: Please be advised that Rx refills may take up to 3 business days. We ask that you follow-up with your pharmacy.

## 2024-09-15 NOTE — Telephone Encounter (Signed)
 06/24/2024 LOV  01/16/2023  30/0 refills

## 2024-09-15 NOTE — Telephone Encounter (Signed)
 20mg  is in Historical  50mg   06/24/2024 LOV  01/16/2023 fill date  30/0 refills

## 2024-09-17 ENCOUNTER — Other Ambulatory Visit: Payer: Self-pay | Admitting: Family Medicine

## 2024-09-17 MED ORDER — VYVANSE 20 MG PO CAPS: 20.0000 mg | ORAL_CAPSULE | Freq: Every morning | ORAL | 0 refills | Status: AC

## 2024-09-23 ENCOUNTER — Telehealth: Payer: Self-pay

## 2024-09-23 ENCOUNTER — Ambulatory Visit: Admitting: Family Medicine

## 2024-09-23 ENCOUNTER — Encounter: Payer: Self-pay | Admitting: Psychiatry

## 2024-09-23 NOTE — Telephone Encounter (Signed)
 Spoke with Sherri Frey regarding her referral to GYN oncology. She has an appointment scheduled with Dr. Eldonna on 10/05/24 at 9:00. Patient agrees to date and time. She has been provided with office address and location. She is also aware of our mask and visitor policy. Patient verbalized understanding and will call with any questions.

## 2024-10-02 ENCOUNTER — Telehealth: Payer: Self-pay

## 2024-10-02 NOTE — Telephone Encounter (Signed)
 Copied from CRM #[id number]. Topic: Clinical - Medication Question >> Oct 02, 2024  1:30 PM Rea ORN wrote: Reason for CRM: Pt called to advise that she has a cyst on her ovaries that cause pain. She went to ED on 1/3 and was prescribed: ondansetron  (ZOFRAN ) 4 MG tablet  oxyCODONE  (ROXICODONE ) 5 MG immediate release tablet Pt can not see a GYN until 2/18 and is asking if PCP will refill these two medications for her in the interim.  Please call back to advise,  [phone]

## 2024-10-02 NOTE — Telephone Encounter (Signed)
 Called and notified patient of Dr. Enos message. Patient verbalized understanding and greed to see surgeon next week or call the office back to schedule an appointment. No other questions or concerns were addressed during the call.

## 2024-10-05 ENCOUNTER — Inpatient Hospital Stay: Admitting: Psychiatry

## 2024-10-06 ENCOUNTER — Telehealth: Payer: Self-pay | Admitting: *Deleted

## 2024-10-06 NOTE — Progress Notes (Signed)
 This encounter was created in error - please disregard.

## 2024-10-06 NOTE — Telephone Encounter (Signed)
 Patient rescheduled from 2/2 to 2/23 due to the weather

## 2024-10-07 ENCOUNTER — Encounter: Payer: Self-pay | Admitting: Family Medicine

## 2024-10-07 ENCOUNTER — Telehealth: Admitting: Family Medicine

## 2024-10-07 DIAGNOSIS — N83201 Unspecified ovarian cyst, right side: Secondary | ICD-10-CM

## 2024-10-07 DIAGNOSIS — R102 Pelvic and perineal pain unspecified side: Secondary | ICD-10-CM

## 2024-10-07 MED ORDER — ONDANSETRON HCL 4 MG PO TABS: 4.0000 mg | ORAL_TABLET | Freq: Four times a day (QID) | ORAL | 1 refills | Status: AC

## 2024-10-07 MED ORDER — OXYCODONE-ACETAMINOPHEN 5-325 MG PO TABS: 1.0000 | ORAL_TABLET | Freq: Four times a day (QID) | ORAL | 0 refills | Status: DC | PRN

## 2024-10-07 NOTE — Progress Notes (Signed)
 " MyChart Video Visit Virtual Visit via Video Note   This visit type was conducted w/patient consent. This format is felt to be most appropriate for this patient at this time. Physical exam was limited by quality of the video and audio technology used for the visit. CMA was able to get the patient set up on a video visit.  Patient location: Work. Patient and provider in visit Provider location: Office  I discussed the limitations of evaluation and management by telemedicine and the availability of in person appointments. The patient expressed understanding and agreed to proceed.  Visit Date: 10/07/2024  Today's healthcare provider: Jenkins CHRISTELLA Carrel, MD     Subjective:    Patient ID: Sherri Frey, female    DOB: 05-05-1981, 44 y.o.   MRN: [id number]  Chief Complaint  Patient presents with   Medication Refill    Pt wants to discuss medications     Discussed the use of AI scribe software for clinical note transcription with the patient, who gave verbal consent to proceed.  History of Present Illness Sherri Frey is a 44 year old female who presents with severe lower abdominal pain due to an ovarian cyst.  She experiences severe cramps even when not on her menstrual period, prompting two hospital visits. During the first visit, she waited nine and a half hours without receiving an ultrasound. On the second visit, an ultrasound revealed a cyst on her right ovary, identified as either abnormal or complex.  She was prescribed Zofran  and oxycodone  for pain management. She has since consulted with a new OB at the [facility], where a blood test for cancer markers returned a result of 37.1, slightly above the normal range. She was referred to an oncologist, with an appointment initially scheduled for this Monday but now moved to the 23rd d/t weather. She has another OB appointment on the 18th of February.  The pain is described as being in the right lower quadrant, but it  radiates across her lower abdomen. It is intermittent, typically occurring between 6 or 7 PM, and can last until she falls asleep. The pain intensity ranges from a 6 or 7 at its lowest to a 9 at its peak. Ibuprofen and acetaminophen  have been ineffective in alleviating the pain.  She is currently out of pain medication and has requested a prescription for Percocet.  she was previously given straight oxycodone . She also requires an anti-nausea medication, such as Zofran , due to a past experience of nausea and vomiting.  No thoughts of suicide.    Past Medical History:  Diagnosis Date   ADHD    Allergy    Anxiety    Arthritis    Depression    Migraine headache without aura    Sleep apnea    has cpap    Past Surgical History:  Procedure Laterality Date   MOUTH SURGERY     at age 39    Outpatient Medications Prior to Visit  Medication Sig Dispense Refill   cetirizine (ZYRTEC ALLERGY) 10 MG tablet 1 tablet Orally Once a day     desvenlafaxine  (PRISTIQ ) 100 MG 24 hr tablet Take 1 tablet (100 mg total) by mouth daily. 90 tablet 1   Ferrous Sulfate (IRON) 325 (65 Fe) MG TABS 1 tablet Orally at least Three times a Week. (Tries to take daily.)     fluticasone  (FLONASE  ALLERGY RELIEF) 50 MCG/ACT nasal spray as needed.     ibuprofen (ADVIL,MOTRIN) 200 MG tablet Take 400  mg by mouth every 6 (six) hours as needed for moderate pain.     lisdexamfetamine  (VYVANSE ) 50 MG capsule Take 1 capsule (50 mg total) by mouth every morning. 30 capsule 0   Multiple Vitamins-Minerals (CENTRUM WOMEN) TABS Take 1 tablet by mouth daily.     pantoprazole (PROTONIX) 20 MG tablet Take 20 mg by mouth every 3 (three) days.     QULIPTA  60 MG TABS Take 1 tablet (60 mg total) by mouth daily. 90 tablet 3   saccharomyces boulardii (FLORASTOR) 250 MG capsule Take 1 capsule by mouth daily.     Semaglutide,0.25 or 0.5MG /DOS, (OZEMPIC, 0.25 OR 0.5 MG/DOSE,) 2 MG/3ML SOPN inject 0.25mg  Subcutaneous once a week     traZODone  (DESYREL) 50 MG tablet Take 50 mg by mouth at bedtime as needed for sleep.     VYVANSE  20 MG capsule Take 1 capsule (20 mg total) by mouth every morning. 30 capsule 0   ondansetron  (ZOFRAN ) 4 MG tablet Take 1 tablet (4 mg total) by mouth every 6 (six) hours. 12 tablet 0   oxyCODONE  (ROXICODONE ) 5 MG immediate release tablet Take 1 tablet (5 mg total) by mouth every 6 (six) hours as needed. 9 tablet 0   No facility-administered medications prior to visit.    Allergies[1]      Objective:     Physical Exam  Vitals and nursing note reviewed.  Constitutional:      General:  is not in acute distress.    Appearance: Normal appearance.  HENT:     Head: Normocephalic.  Pulmonary:     Effort: No respiratory distress.  Skin:    General: Skin is dry.     Coloration: Skin is not pale.  Neurological:     Mental Status: Pt is alert and oriented to person, place, and time.  Psychiatric:        Mood and Affect: Mood normal.   Reviewed ER records  There were no vitals taken for this visit.  Wt Readings from Last 3 Encounters:  09/05/24 252 lb (114.3 kg)  06/24/24 253 lb (114.8 kg)  05/20/24 256 lb (116.1 kg)       Assessment & Plan:   Problem List Items Addressed This Visit   None Visit Diagnoses       Pelvic pain    -  Primary     Cyst of right ovary           Assessment and Plan Assessment & Plan Right ovarian cyst with pelvic pain   Intermittent pelvic pain occurs daily, primarily in the right lower quadrant, radiating across the lower abdomen, with severity ranging from 6 to 9 out of 10. Pain is attributed to a complex right ovarian cyst identified via ultrasound. Elevated CA-125 level at 37.1 is slightly above normal. Awaiting oncologist evaluation to determine surgical intervention, potentially involving removal of the right ovary, with discussion of early menopause risk if the left ovary is removed. No suicidal ideation reported. Pain management is necessary until  surgical intervention. Prescribed Percocet 5/325 for pain management and advised stool softeners to prevent constipation from narcotics.PDMP checked. Continue ibuprofen, Advil, or Aleve for additional pain relief. Prescribed ondansetron  for nausea management. Ensure follow-up with OB and oncologist for further management and potential surgical intervention.      Meds ordered this encounter  Medications   ondansetron  (ZOFRAN ) 4 MG tablet    Sig: Take 1 tablet (4 mg total) by mouth every 6 (six) hours.  Dispense:  30 tablet    Refill:  1   oxyCODONE -acetaminophen  (PERCOCET/ROXICET) 5-325 MG tablet    Sig: Take 1 tablet by mouth every 6 (six) hours as needed for up to 5 days for severe pain (pain score 7-10).    Dispense:  20 tablet    Refill:  0    I discussed the assessment and treatment plan with the patient. The patient was provided an opportunity to ask questions and all were answered. The patient agreed with the plan and demonstrated an understanding of the instructions.   The patient was advised to call back or seek an in-person evaluation if the symptoms worsen or if the condition fails to improve as anticipated.  Return if symptoms worsen or fail to improve.  Jenkins CHRISTELLA Carrel, MD [facility] [phone] (phone) [phone] (fax)  [facility]      [1]  Allergies Allergen Reactions   Coconut Oil Itching and Anaphylaxis    vomiting   "

## 2024-10-07 NOTE — Patient Instructions (Signed)
 Meds sent If need refill, just message us .

## 2024-10-09 ENCOUNTER — Other Ambulatory Visit: Payer: Self-pay | Admitting: Family Medicine

## 2024-10-09 ENCOUNTER — Telehealth: Payer: Self-pay | Admitting: *Deleted

## 2024-10-09 ENCOUNTER — Telehealth: Payer: Self-pay

## 2024-10-09 MED ORDER — OXYCODONE-ACETAMINOPHEN 5-325 MG PO TABS: 1.0000 | ORAL_TABLET | Freq: Four times a day (QID) | ORAL | 0 refills | Status: AC | PRN

## 2024-10-09 NOTE — Telephone Encounter (Signed)
 Spoke with the patient and moved appt from 2/23 to 2/9

## 2024-10-09 NOTE — Telephone Encounter (Signed)
 Copied from CRM [id number]. Topic: Clinical - Prescription Issue >> Oct 08, 2024  5:48 PM Charolett L wrote: Reason for CRM: Patient stated that pharmacy never received the script for  oxyCODONE -acetaminophen  (PERCOCET/ROXICET) 5-325 MG tablet. Patient requesting for script to be sent to pharmacy for pickup

## 2024-10-12 ENCOUNTER — Inpatient Hospital Stay: Admitting: Psychiatry

## 2024-10-26 ENCOUNTER — Inpatient Hospital Stay: Admitting: Psychiatry

## 2024-11-04 ENCOUNTER — Ambulatory Visit: Admitting: Family Medicine

## 2025-05-12 ENCOUNTER — Encounter: Admitting: Family Medicine
